# Patient Record
Sex: Male | Born: 1945 | Race: Black or African American | Hispanic: No | Marital: Married | Smoking: Former smoker
Health system: Southern US, Community
[De-identification: ages and names within clinical notes are randomized; demographics above are authoritative.]

## PROBLEM LIST (undated history)

## (undated) DIAGNOSIS — F329 Major depressive disorder, single episode, unspecified: Secondary | ICD-10-CM

## (undated) DIAGNOSIS — I739 Peripheral vascular disease, unspecified: Secondary | ICD-10-CM

## (undated) DIAGNOSIS — M109 Gout, unspecified: Secondary | ICD-10-CM

## (undated) DIAGNOSIS — Z87442 Personal history of urinary calculi: Secondary | ICD-10-CM

## (undated) DIAGNOSIS — N400 Enlarged prostate without lower urinary tract symptoms: Secondary | ICD-10-CM

## (undated) DIAGNOSIS — F32A Depression, unspecified: Secondary | ICD-10-CM

## (undated) DIAGNOSIS — I4891 Unspecified atrial fibrillation: Secondary | ICD-10-CM

## (undated) DIAGNOSIS — E1129 Type 2 diabetes mellitus with other diabetic kidney complication: Secondary | ICD-10-CM

## (undated) DIAGNOSIS — E7800 Pure hypercholesterolemia, unspecified: Secondary | ICD-10-CM

## (undated) DIAGNOSIS — E669 Obesity, unspecified: Secondary | ICD-10-CM

## (undated) DIAGNOSIS — Z9989 Dependence on other enabling machines and devices: Secondary | ICD-10-CM

## (undated) DIAGNOSIS — J449 Chronic obstructive pulmonary disease, unspecified: Secondary | ICD-10-CM

## (undated) DIAGNOSIS — G4733 Obstructive sleep apnea (adult) (pediatric): Secondary | ICD-10-CM

## (undated) DIAGNOSIS — E119 Type 2 diabetes mellitus without complications: Secondary | ICD-10-CM

## (undated) DIAGNOSIS — N184 Chronic kidney disease, stage 4 (severe): Secondary | ICD-10-CM

## (undated) DIAGNOSIS — M1990 Unspecified osteoarthritis, unspecified site: Secondary | ICD-10-CM

## (undated) DIAGNOSIS — I10 Essential (primary) hypertension: Secondary | ICD-10-CM

## (undated) DIAGNOSIS — I509 Heart failure, unspecified: Secondary | ICD-10-CM

## (undated) HISTORY — PX: HERNIA REPAIR: SHX51

## (undated) HISTORY — PX: TEE WITH CARDIOVERSION: SHX5442

## (undated) HISTORY — DX: Obesity, unspecified: E66.9

## (undated) HISTORY — DX: Peripheral vascular disease, unspecified: I73.9

## (undated) HISTORY — DX: Type 2 diabetes mellitus with other diabetic kidney complication: E11.29

## (undated) HISTORY — DX: Chronic obstructive pulmonary disease, unspecified: J44.9

## (undated) HISTORY — DX: Benign prostatic hyperplasia without lower urinary tract symptoms: N40.0

## (undated) HISTORY — DX: Type 2 diabetes mellitus without complications: E11.9

## (undated) HISTORY — DX: Heart failure, unspecified: I50.9

## (undated) HISTORY — PX: INGUINAL HERNIA REPAIR: SUR1180

## (undated) HISTORY — PX: UMBILICAL HERNIA REPAIR: SHX196

---

## 2016-12-30 ENCOUNTER — Inpatient Hospital Stay (HOSPITAL_COMMUNITY)
Admission: EM | Admit: 2016-12-30 | Discharge: 2017-01-04 | DRG: 291 | Disposition: A | Discharge status: Skilled Nursing Facility | Payer: Medicare Other | Attending: Internal Medicine | Admitting: Internal Medicine

## 2016-12-30 ENCOUNTER — Other Ambulatory Visit: Payer: Self-pay

## 2016-12-30 ENCOUNTER — Emergency Department (HOSPITAL_COMMUNITY): Payer: Medicare Other

## 2016-12-30 ENCOUNTER — Encounter (HOSPITAL_COMMUNITY): Payer: Self-pay | Admitting: *Deleted

## 2016-12-30 DIAGNOSIS — Z87891 Personal history of nicotine dependence: Secondary | ICD-10-CM

## 2016-12-30 DIAGNOSIS — I509 Heart failure, unspecified: Secondary | ICD-10-CM

## 2016-12-30 DIAGNOSIS — J9859 Other diseases of mediastinum, not elsewhere classified: Secondary | ICD-10-CM | POA: Diagnosis present

## 2016-12-30 DIAGNOSIS — R599 Enlarged lymph nodes, unspecified: Secondary | ICD-10-CM | POA: Diagnosis present

## 2016-12-30 DIAGNOSIS — G4730 Sleep apnea, unspecified: Secondary | ICD-10-CM | POA: Diagnosis present

## 2016-12-30 DIAGNOSIS — Z79899 Other long term (current) drug therapy: Secondary | ICD-10-CM

## 2016-12-30 DIAGNOSIS — N184 Chronic kidney disease, stage 4 (severe): Secondary | ICD-10-CM | POA: Diagnosis present

## 2016-12-30 DIAGNOSIS — R0902 Hypoxemia: Secondary | ICD-10-CM

## 2016-12-30 DIAGNOSIS — I10 Essential (primary) hypertension: Secondary | ICD-10-CM | POA: Diagnosis present

## 2016-12-30 DIAGNOSIS — I4892 Unspecified atrial flutter: Secondary | ICD-10-CM | POA: Diagnosis present

## 2016-12-30 DIAGNOSIS — Z7901 Long term (current) use of anticoagulants: Secondary | ICD-10-CM

## 2016-12-30 DIAGNOSIS — N289 Disorder of kidney and ureter, unspecified: Secondary | ICD-10-CM

## 2016-12-30 DIAGNOSIS — E7800 Pure hypercholesterolemia, unspecified: Secondary | ICD-10-CM | POA: Diagnosis present

## 2016-12-30 DIAGNOSIS — R911 Solitary pulmonary nodule: Secondary | ICD-10-CM | POA: Diagnosis present

## 2016-12-30 DIAGNOSIS — I130 Hypertensive heart and chronic kidney disease with heart failure and stage 1 through stage 4 chronic kidney disease, or unspecified chronic kidney disease: Principal | ICD-10-CM | POA: Diagnosis present

## 2016-12-30 DIAGNOSIS — I2721 Secondary pulmonary arterial hypertension: Secondary | ICD-10-CM | POA: Diagnosis present

## 2016-12-30 DIAGNOSIS — I5033 Acute on chronic diastolic (congestive) heart failure: Secondary | ICD-10-CM | POA: Diagnosis present

## 2016-12-30 DIAGNOSIS — R0602 Shortness of breath: Secondary | ICD-10-CM

## 2016-12-30 DIAGNOSIS — I472 Ventricular tachycardia: Secondary | ICD-10-CM | POA: Diagnosis not present

## 2016-12-30 DIAGNOSIS — D649 Anemia, unspecified: Secondary | ICD-10-CM | POA: Diagnosis present

## 2016-12-30 DIAGNOSIS — G4733 Obstructive sleep apnea (adult) (pediatric): Secondary | ICD-10-CM | POA: Diagnosis present

## 2016-12-30 DIAGNOSIS — Z6841 Body Mass Index (BMI) 40.0 and over, adult: Secondary | ICD-10-CM

## 2016-12-30 DIAGNOSIS — I4891 Unspecified atrial fibrillation: Secondary | ICD-10-CM | POA: Diagnosis present

## 2016-12-30 DIAGNOSIS — R600 Localized edema: Secondary | ICD-10-CM

## 2016-12-30 HISTORY — DX: Essential (primary) hypertension: I10

## 2016-12-30 HISTORY — DX: Personal history of urinary calculi: Z87.442

## 2016-12-30 HISTORY — DX: Major depressive disorder, single episode, unspecified: F32.9

## 2016-12-30 HISTORY — DX: Pure hypercholesterolemia, unspecified: E78.00

## 2016-12-30 HISTORY — DX: Depression, unspecified: F32.A

## 2016-12-30 HISTORY — DX: Unspecified atrial fibrillation: I48.91

## 2016-12-30 HISTORY — DX: Obstructive sleep apnea (adult) (pediatric): G47.33

## 2016-12-30 HISTORY — DX: Dependence on other enabling machines and devices: Z99.89

## 2016-12-30 HISTORY — DX: Type 2 diabetes mellitus without complications: E11.9

## 2016-12-30 HISTORY — DX: Gout, unspecified: M10.9

## 2016-12-30 HISTORY — DX: Unspecified osteoarthritis, unspecified site: M19.90

## 2016-12-30 HISTORY — DX: Heart failure, unspecified: I50.9

## 2016-12-30 HISTORY — DX: Chronic kidney disease, stage 4 (severe): N18.4

## 2016-12-30 LAB — CBC
HEMATOCRIT: 35.2 % — AB (ref 39.0–52.0)
HEMOGLOBIN: 10.7 g/dL — AB (ref 13.0–17.0)
MCH: 30.3 pg (ref 26.0–34.0)
MCHC: 30.4 g/dL (ref 30.0–36.0)
MCV: 99.7 fL (ref 78.0–100.0)
Platelets: 218 10*3/uL (ref 150–400)
RBC: 3.53 MIL/uL — ABNORMAL LOW (ref 4.22–5.81)
RDW: 15.1 % (ref 11.5–15.5)
WBC: 4 10*3/uL (ref 4.0–10.5)

## 2016-12-30 LAB — BASIC METABOLIC PANEL
ANION GAP: 8 (ref 5–15)
BUN: 35 mg/dL — AB (ref 6–20)
CO2: 27 mmol/L (ref 22–32)
Calcium: 8.5 mg/dL — ABNORMAL LOW (ref 8.9–10.3)
Chloride: 104 mmol/L (ref 101–111)
Creatinine, Ser: 3.28 mg/dL — ABNORMAL HIGH (ref 0.61–1.24)
GFR calc non Af Amer: 18 mL/min — ABNORMAL LOW (ref >60)
GFR, EST AFRICAN AMERICAN: 21 mL/min — AB (ref >60)
GLUCOSE: 147 mg/dL — AB (ref 65–99)
Potassium: 4.2 mmol/L (ref 3.5–5.1)
SODIUM: 139 mmol/L (ref 135–145)

## 2016-12-30 LAB — I-STAT TROPONIN, ED: Troponin i, poc: 0.01 ng/mL (ref 0.00–0.08)

## 2016-12-30 MED ORDER — ALBUTEROL SULFATE (2.5 MG/3ML) 0.083% IN NEBU
5.0000 mg | INHALATION_SOLUTION | Freq: Once | RESPIRATORY_TRACT | Status: DC
  Filled 2016-12-30: qty 6

## 2016-12-30 NOTE — ED Triage Notes (Signed)
Pt reports SOB and "falling asleep", states "I could be standing at home, next thing I know I am picking myself up off the floor."  Pt and his wife reports that this has been going on for months.  She states she has been trying to convince him to come to the ED to get checked out but he kept saying that he is okay.  She also reports that he coughs up "frothy" sputum.  Pt is A&O  X 4.  He endorses cp when coughing.  He wears c-pap at night.  No home O2.  Pt reports swelling in his bila LE and he gets SOB on exertion.

## 2016-12-31 ENCOUNTER — Encounter (HOSPITAL_COMMUNITY): Payer: Self-pay | Admitting: Family Medicine

## 2016-12-31 ENCOUNTER — Other Ambulatory Visit: Payer: Self-pay

## 2016-12-31 ENCOUNTER — Emergency Department (HOSPITAL_COMMUNITY): Payer: Medicare Other

## 2016-12-31 ENCOUNTER — Other Ambulatory Visit (HOSPITAL_COMMUNITY): Payer: Medicare Other

## 2016-12-31 DIAGNOSIS — Z79899 Other long term (current) drug therapy: Secondary | ICD-10-CM | POA: Diagnosis not present

## 2016-12-31 DIAGNOSIS — J9859 Other diseases of mediastinum, not elsewhere classified: Secondary | ICD-10-CM | POA: Diagnosis present

## 2016-12-31 DIAGNOSIS — R0902 Hypoxemia: Secondary | ICD-10-CM

## 2016-12-31 DIAGNOSIS — I2721 Secondary pulmonary arterial hypertension: Secondary | ICD-10-CM | POA: Diagnosis present

## 2016-12-31 DIAGNOSIS — I5033 Acute on chronic diastolic (congestive) heart failure: Secondary | ICD-10-CM | POA: Diagnosis present

## 2016-12-31 DIAGNOSIS — I130 Hypertensive heart and chronic kidney disease with heart failure and stage 1 through stage 4 chronic kidney disease, or unspecified chronic kidney disease: Secondary | ICD-10-CM | POA: Diagnosis present

## 2016-12-31 DIAGNOSIS — I509 Heart failure, unspecified: Secondary | ICD-10-CM | POA: Diagnosis not present

## 2016-12-31 DIAGNOSIS — D649 Anemia, unspecified: Secondary | ICD-10-CM | POA: Diagnosis present

## 2016-12-31 DIAGNOSIS — N184 Chronic kidney disease, stage 4 (severe): Secondary | ICD-10-CM | POA: Diagnosis present

## 2016-12-31 DIAGNOSIS — R599 Enlarged lymph nodes, unspecified: Secondary | ICD-10-CM | POA: Diagnosis present

## 2016-12-31 DIAGNOSIS — I10 Essential (primary) hypertension: Secondary | ICD-10-CM | POA: Diagnosis present

## 2016-12-31 DIAGNOSIS — Z87891 Personal history of nicotine dependence: Secondary | ICD-10-CM | POA: Diagnosis not present

## 2016-12-31 DIAGNOSIS — R911 Solitary pulmonary nodule: Secondary | ICD-10-CM | POA: Diagnosis present

## 2016-12-31 DIAGNOSIS — I472 Ventricular tachycardia: Secondary | ICD-10-CM | POA: Diagnosis not present

## 2016-12-31 DIAGNOSIS — G4730 Sleep apnea, unspecified: Secondary | ICD-10-CM | POA: Diagnosis present

## 2016-12-31 DIAGNOSIS — I4891 Unspecified atrial fibrillation: Secondary | ICD-10-CM | POA: Diagnosis present

## 2016-12-31 DIAGNOSIS — G4733 Obstructive sleep apnea (adult) (pediatric): Secondary | ICD-10-CM | POA: Diagnosis present

## 2016-12-31 DIAGNOSIS — Z7901 Long term (current) use of anticoagulants: Secondary | ICD-10-CM | POA: Diagnosis not present

## 2016-12-31 DIAGNOSIS — Z6841 Body Mass Index (BMI) 40.0 and over, adult: Secondary | ICD-10-CM | POA: Diagnosis not present

## 2016-12-31 DIAGNOSIS — R0602 Shortness of breath: Secondary | ICD-10-CM | POA: Diagnosis present

## 2016-12-31 DIAGNOSIS — I361 Nonrheumatic tricuspid (valve) insufficiency: Secondary | ICD-10-CM | POA: Diagnosis not present

## 2016-12-31 DIAGNOSIS — E7800 Pure hypercholesterolemia, unspecified: Secondary | ICD-10-CM | POA: Diagnosis present

## 2016-12-31 DIAGNOSIS — I4892 Unspecified atrial flutter: Secondary | ICD-10-CM | POA: Diagnosis present

## 2016-12-31 HISTORY — DX: Heart failure, unspecified: I50.9

## 2016-12-31 LAB — I-STAT ARTERIAL BLOOD GAS, ED
Acid-Base Excess: 3 mmol/L — ABNORMAL HIGH (ref 0.0–2.0)
Bicarbonate: 29.2 mmol/L — ABNORMAL HIGH (ref 20.0–28.0)
O2 Saturation: 92 %
PCO2 ART: 52.8 mmHg — AB (ref 32.0–48.0)
PO2 ART: 69 mmHg — AB (ref 83.0–108.0)
Patient temperature: 98.7
TCO2: 31 mmol/L (ref 22–32)
pH, Arterial: 7.351 (ref 7.350–7.450)

## 2016-12-31 LAB — PROTIME-INR
INR: 4.24 — AB
PROTHROMBIN TIME: 40.2 s — AB (ref 11.4–15.2)

## 2016-12-31 LAB — BRAIN NATRIURETIC PEPTIDE: B NATRIURETIC PEPTIDE 5: 259.1 pg/mL — AB (ref 0.0–100.0)

## 2016-12-31 LAB — TROPONIN I

## 2016-12-31 MED ORDER — ONDANSETRON HCL 4 MG/2ML IJ SOLN: 4.0000 mg | Freq: Four times a day (QID) | INTRAMUSCULAR | Status: DC | PRN

## 2016-12-31 MED ORDER — FUROSEMIDE 10 MG/ML IJ SOLN
40.0000 mg | Freq: Two times a day (BID) | INTRAMUSCULAR | Status: DC
  Administered 2016-12-31 – 2017-01-01 (×4): 40 mg via INTRAVENOUS
  Filled 2016-12-31 (×4): qty 4

## 2016-12-31 MED ORDER — SODIUM CHLORIDE 0.9% FLUSH: 3.0000 mL | INTRAVENOUS | Status: DC | PRN

## 2016-12-31 MED ORDER — ZINC SULFATE 220 (50 ZN) MG PO CAPS
220.0000 mg | ORAL_CAPSULE | Freq: Every day | ORAL | Status: DC
  Administered 2016-12-31 – 2017-01-04 (×5): 220 mg via ORAL
  Filled 2016-12-31 (×5): qty 1

## 2016-12-31 MED ORDER — FLUTICASONE PROPIONATE 50 MCG/ACT NA SUSP: 2.0000 | Freq: Every day | NASAL | Status: DC | PRN

## 2016-12-31 MED ORDER — LIP MEDEX EX OINT
1.0000 "application " | TOPICAL_OINTMENT | CUTANEOUS | Status: DC | PRN
  Filled 2016-12-31: qty 7

## 2016-12-31 MED ORDER — SALINE SPRAY 0.65 % NA SOLN
1.0000 | NASAL | Status: DC | PRN
  Filled 2016-12-31: qty 44

## 2016-12-31 MED ORDER — VITAMIN D 1000 UNITS PO TABS
2000.0000 [IU] | ORAL_TABLET | Freq: Every day | ORAL | Status: DC
  Administered 2016-12-31 – 2017-01-04 (×5): 2000 [IU] via ORAL
  Filled 2016-12-31 (×5): qty 2

## 2016-12-31 MED ORDER — SODIUM CHLORIDE 0.9% FLUSH
3.0000 mL | Freq: Two times a day (BID) | INTRAVENOUS | Status: DC
  Administered 2016-12-31 – 2017-01-04 (×7): 3 mL via INTRAVENOUS

## 2016-12-31 MED ORDER — MUSCLE RUB 10-15 % EX CREA
1.0000 "application " | TOPICAL_CREAM | CUTANEOUS | Status: DC | PRN
  Administered 2017-01-02: 1 via TOPICAL
  Filled 2016-12-31: qty 85

## 2016-12-31 MED ORDER — PRAVASTATIN SODIUM 40 MG PO TABS
40.0000 mg | ORAL_TABLET | Freq: Every day | ORAL | Status: DC
  Administered 2016-12-31 – 2017-01-03 (×4): 40 mg via ORAL
  Filled 2016-12-31 (×4): qty 1

## 2016-12-31 MED ORDER — TAMSULOSIN HCL 0.4 MG PO CAPS
0.4000 mg | ORAL_CAPSULE | Freq: Every day | ORAL | Status: DC
  Administered 2016-12-31 – 2017-01-03 (×4): 0.4 mg via ORAL
  Filled 2016-12-31 (×4): qty 1

## 2016-12-31 MED ORDER — PHENOL 1.4 % MT LIQD: 1.0000 | OROMUCOSAL | Status: DC | PRN

## 2016-12-31 MED ORDER — LABETALOL HCL 200 MG PO TABS
300.0000 mg | ORAL_TABLET | Freq: Two times a day (BID) | ORAL | Status: DC
  Administered 2016-12-31 – 2017-01-04 (×9): 300 mg via ORAL
  Filled 2016-12-31 (×8): qty 1

## 2016-12-31 MED ORDER — DOCUSATE SODIUM 100 MG PO CAPS
100.0000 mg | ORAL_CAPSULE | Freq: Two times a day (BID) | ORAL | Status: DC
  Administered 2016-12-31 – 2017-01-04 (×8): 100 mg via ORAL
  Filled 2016-12-31 (×9): qty 1

## 2016-12-31 MED ORDER — HYDROCORTISONE 2.5 % RE CREA: 1.0000 "application " | TOPICAL_CREAM | Freq: Four times a day (QID) | RECTAL | Status: DC | PRN

## 2016-12-31 MED ORDER — HYDRALAZINE HCL 20 MG/ML IJ SOLN
10.0000 mg | INTRAMUSCULAR | Status: DC | PRN
  Administered 2016-12-31: 10 mg via INTRAVENOUS
  Filled 2016-12-31: qty 1

## 2016-12-31 MED ORDER — ALUM & MAG HYDROXIDE-SIMETH 200-200-20 MG/5ML PO SUSP: 30.0000 mL | ORAL | Status: DC | PRN

## 2016-12-31 MED ORDER — B COMPLEX-C PO TABS
1.0000 | ORAL_TABLET | Freq: Every day | ORAL | Status: DC
  Administered 2016-12-31 – 2017-01-04 (×5): 1 via ORAL
  Filled 2016-12-31 (×5): qty 1

## 2016-12-31 MED ORDER — FUROSEMIDE 10 MG/ML IJ SOLN
80.0000 mg | Freq: Once | INTRAMUSCULAR | Status: AC
  Administered 2016-12-31: 80 mg via INTRAVENOUS
  Filled 2016-12-31: qty 8

## 2016-12-31 MED ORDER — ACETAMINOPHEN 325 MG PO TABS
650.0000 mg | ORAL_TABLET | ORAL | Status: DC | PRN
  Administered 2017-01-02 (×2): 650 mg via ORAL
  Filled 2016-12-31 (×2): qty 2

## 2016-12-31 MED ORDER — BLISTEX MEDICATED EX OINT
TOPICAL_OINTMENT | CUTANEOUS | Status: DC | PRN
  Filled 2016-12-31: qty 6.3

## 2016-12-31 MED ORDER — AMLODIPINE BESYLATE 10 MG PO TABS
10.0000 mg | ORAL_TABLET | Freq: Every day | ORAL | Status: DC
  Administered 2016-12-31 – 2017-01-04 (×5): 10 mg via ORAL
  Filled 2016-12-31 (×2): qty 1
  Filled 2016-12-31: qty 2
  Filled 2016-12-31 (×2): qty 1

## 2016-12-31 MED ORDER — B COMPLEX VITAMINS PO CAPS: 1.0000 | ORAL_CAPSULE | Freq: Every day | ORAL | Status: DC

## 2016-12-31 MED ORDER — GUAIFENESIN-DM 100-10 MG/5ML PO SYRP
5.0000 mL | ORAL_SOLUTION | ORAL | Status: DC | PRN
  Administered 2017-01-02 – 2017-01-04 (×2): 5 mL via ORAL
  Filled 2016-12-31 (×2): qty 5

## 2016-12-31 MED ORDER — HEPARIN SODIUM (PORCINE) 5000 UNIT/ML IJ SOLN: 5000.0000 [IU] | Freq: Three times a day (TID) | INTRAMUSCULAR | Status: DC

## 2016-12-31 MED ORDER — POLYVINYL ALCOHOL 1.4 % OP SOLN
2.0000 [drp] | OPHTHALMIC | Status: DC | PRN
  Filled 2016-12-31: qty 15

## 2016-12-31 MED ORDER — SODIUM CHLORIDE 0.9 % IV SOLN: 250.0000 mL | INTRAVENOUS | Status: DC | PRN

## 2016-12-31 NOTE — Progress Notes (Signed)
Patient seen and evaluated earlier today by my associate. Plan will be to continue workup described in H&P.  Patient will be continued on Lasix 40 mg IV every 12 hours. Will assess results of echocardiogram. Continue beta blocker given history of atrial flutter.  Gen.: Patient in no acute distress Cardiovascular: S1 and S2 within normal limits Pulmonary: No increased work of breathing, no wheezes  [name], [employer]

## 2016-12-31 NOTE — ED Notes (Signed)
Ambulated pt w/ pulse ox. Pt managed to ambulate a little around NS and started feeling SHOB & desating. Deasated down to 92% & HR went up to 112. EDP made aware.

## 2016-12-31 NOTE — ED Notes (Signed)
Heart healthy lunch tray ordered 

## 2016-12-31 NOTE — H&P (Signed)
History and Physical    Mark Adkins OIN:[id number] DOB: 10-01-1945 DOA: 12/30/2016  PCP: Sinclair Ship, MD   Patient coming from: Home  Chief Complaint: SOB, swelling, orthopnea   HPI: Mark Adkins is a 71 y.o. male with medical history significant for atrial fibrillation/flutter on warfarin, chronic CHF, chronic kidney disease stage IV, hypertension, and OSA on CPAP, now presenting to the emergency department for evaluation of progressive shortness of breath, orthopnea, and swelling.  Patient also reports lethargy and increased somnolence for the past couple months.  Wife notes that she has been encouraging him to seek medical evaluation for this for multiple months without success until tonight.  Denies chest pain.  Denies fevers or chills.  Reports occasionally coughing up frothy sputum.  Has been using increasing number of pillows in order to sleep at night.  Reports some chronic edema, but notes that it has progressively worsened over the past couple months and he has gained weight.  ED Course: Upon arrival to the ED, patient is found to be afebrile, saturating low 90s on room air, hypertensive, and with vitals otherwise stable.  EKG features atrial flutter with nonspecific IVCD and inferolateral ST elevations.  Chest x-ray is notable for increased pulmonary vascular congestion.  Chemistry panel features of a BUN of 35 and creatinine of 3.28, up from 2.3 two years ago.  CBC is notable for a normocytic anemia with hemoglobin of 10.7.  INR is elevated to a value of 4.24, BNP is elevated to 259, and troponin is negative x2.  There was a subtle abnormality on the chest x-ray that was followed with CT chest, revealing a tiny right lower lobe subpleural nodule and prominent paratracheal lymph node versus fluid in the superior pericardial recess.  Patient was treated with 80 mg IV Lasix in the ED.  He remained a little hypertensive, but otherwise stable, and will be admitted to the telemetry unit for  ongoing evaluation and shortness of breath, swelling, and orthopnea, suspected secondary to acute on chronic CHF.  Review of Systems:  All other systems reviewed and apart from HPI, are negative.  Past Medical History:  Diagnosis Date  . Atrial fibrillation ([place])   . Hypercholesterolemia   . Hypertension   . Sleep apnea     Past Surgical History:  Procedure Laterality Date  . ABDOMINAL SURGERY     hernia repair     reports that  has never smoked. he has never used smokeless tobacco. He reports that he does not drink alcohol or use drugs.  No Known Allergies  Family History  Problem Relation Age of Onset  . Sudden Cardiac Death Neg Hx      Prior to Admission medications   Medication Sig Start Date End Date Taking? Authorizing Provider  acetaminophen (TYLENOL) 500 MG tablet Take 1,000 mg by mouth 2 (two) times daily.   Yes [provider]  amLODipine (NORVASC) 10 MG tablet Take 10 mg by mouth daily.   Yes [provider]  Ascorbic Acid (VITAMIN C WITH ROSE HIPS) 1000 MG tablet Take 1,000 mg by mouth daily.   Yes [provider]  b complex vitamins capsule Take 1 capsule by mouth daily.   Yes [provider]  Cholecalciferol (VITAMIN D3) 2000 units capsule Take 2,000 Units by mouth daily.   Yes [provider]  docusate sodium (COLACE) 100 MG capsule Take 100 mg by mouth 2 (two) times daily.   Yes [provider]  fluticasone (FLONASE) 50 MCG/ACT nasal spray  Place 2 sprays into both nostrils daily as needed for allergies or rhinitis.   Yes [provider]  furosemide (LASIX) 40 MG tablet Take 40 mg by mouth 2 (two) times daily.   Yes [provider]  labetalol (NORMODYNE) 300 MG tablet Take 300 mg by mouth 2 (two) times daily.   Yes [provider]  nitroGLYCERIN (NITROSTAT) 0.4 MG SL tablet Place 0.4 mg under the tongue every 5 (five) minutes as needed for chest pain.   Yes [provider]    pravastatin (PRAVACHOL) 40 MG tablet Take 40 mg by mouth at bedtime. 02/09/14  Yes [provider]  tamsulosin (FLOMAX) 0.4 MG CAPS capsule Take 0.4 mg by mouth daily after supper.   Yes [provider]  vitamin E 1000 UNIT capsule Take 1,000 Units by mouth daily.   Yes [provider]  warfarin (COUMADIN) 5 MG tablet Take 5 mg by mouth See admin instructions. Take 7.5 mg  On Sun. Mon, Wed and Friday Take 10 mg tues, thurs, and saturday   Yes [provider]  zinc sulfate 220 (50 Zn) MG capsule Take 220 mg by mouth daily.   Yes [provider]    Physical Exam: Vitals:   12/31/16 0000 12/31/16 0130 12/31/16 0200 12/31/16 0230  BP: (!) 142/97 (!) 154/96 (!) 161/107 (!) 169/94  Pulse: 74 93 86 (!) 53  Resp: 19 20 16 17   Temp:      TempSrc:      SpO2: 94% 95% 91% 90%  Weight:      Height:          Constitutional: NAD, calm, obese Eyes: PERTLA, lids and conjunctivae normal ENMT: Mucous membranes are moist. Posterior pharynx clear of any exudate or lesions.   Neck: normal, supple, no masses, no thyromegaly Respiratory: clear to auscultation bilaterally, no wheezing, no crackles. Dyspnea with speech. No accessory muscle recruitment. Cardiovascular: S1 & S2 heard, regular rate and rhythm. Bilateral leg edema.  Abdomen: No distension, no tenderness, no masses palpated. Bowel sounds normal.  Musculoskeletal: no clubbing / cyanosis. No joint deformity upper and lower extremities.   Skin: no significant rashes, lesions, ulcers. Warm, dry, well-perfused. Neurologic: CN 2-12 grossly intact. Sensation intact. Strength 5/5 in all 4 limbs.  Psychiatric: Alert and oriented x 3. Calm, cooperative.     Labs on Admission: I have personally reviewed following labs and imaging studies  CBC: Recent Labs  Lab 12/30/16 1629  WBC 4.0  HGB 10.7*  HCT 35.2*  MCV 99.7  PLT 301   Basic Metabolic Panel: Recent Labs  Lab 12/30/16 1629  NA 139  K 4.2   CL 104  CO2 27  GLUCOSE 147*  BUN 35*  CREATININE 3.28*  CALCIUM 8.5*   GFR: Estimated Creatinine Clearance: 32.1 mL/min (A) (by C-G formula based on SCr of 3.28 mg/dL (H)). Liver Function Tests: No results for input(s): AST, ALT, ALKPHOS, BILITOT, PROT, ALBUMIN in the last 168 hours. No results for input(s): LIPASE, AMYLASE in the last 168 hours. No results for input(s): AMMONIA in the last 168 hours. Coagulation Profile: Recent Labs  Lab 12/31/16 0052  INR 4.24*   Cardiac Enzymes: Recent Labs  Lab 12/31/16 0052  TROPONINI <0.03   BNP (last 3 results) No results for input(s): PROBNP in the last 8760 hours. HbA1C: No results for input(s): HGBA1C in the last 72 hours. CBG: No results for input(s): GLUCAP in the last 168 hours. Lipid Profile: No results for input(s): CHOL,  HDL, LDLCALC, TRIG, CHOLHDL, LDLDIRECT in the last 72 hours. Thyroid Function Tests: No results for input(s): TSH, T4TOTAL, FREET4, T3FREE, THYROIDAB in the last 72 hours. Anemia Panel: No results for input(s): VITAMINB12, FOLATE, FERRITIN, TIBC, IRON, RETICCTPCT in the last 72 hours. Urine analysis: No results found for: COLORURINE, APPEARANCEUR, LABSPEC, PHURINE, GLUCOSEU, HGBUR, BILIRUBINUR, KETONESUR, PROTEINUR, UROBILINOGEN, NITRITE, LEUKOCYTESUR Sepsis Labs: @LABRCNTIP (procalcitonin:4,lacticidven:4) )No results found for this or any previous visit (from the past 240 hour(s)).   Radiological Exams on Admission: Dg Chest 2 View  Result Date: 12/30/2016 CLINICAL DATA:  71 year old male with increasing shortness of breath with exertion. Chronic cough. EXAM: CHEST  2 VIEW COMPARISON:  None. FINDINGS: Cardiac size is at the upper limits of normal to mildly enlarged. There is mild tortuosity of the thoracic aorta. There is prominent central pulmonary vasculature, and an asymmetrically rounded appearance of the right hilum on the frontal view. Questionable hilar enlargement on the lateral view (3-3.5 cm  diameter). Randolm Idol* ED Delice Lesch a mother Visualized tracheal air column is within normal limits. No pneumothorax, pleural effusion or confluent pulmonary opacity. Increased pulmonary vascularity, but no minimal if any pleural fluid in the fissures. Negative visible bowel gas pattern. No acute osseous abnormality identified. IMPRESSION: 1. No prior study for comparison. 2. Questionable abnormal right hilar enlargement. CT Chest with IV contrast would evaluate further. 3. Increased pulmonary vascularity, such that mild or developing interstitial edema is difficult to exclude. No pleural effusion identified. 4. Mild cardiomegaly. Electronically Signed   By: Genevie Ann M.D.   On: 12/30/2016 17:11   Ct Chest Wo Contrast  Result Date: 12/31/2016 CLINICAL DATA:  Shortness of breath. EXAM: CT CHEST WITHOUT CONTRAST TECHNIQUE: Multidetector CT imaging of the chest was performed following the standard protocol without IV contrast. COMPARISON:  Chest radiographs yesterday. FINDINGS: Cardiovascular: Mild multi chamber cardiomegaly. There are coronary artery calcifications. Enlargement of the main pulmonary artery measuring 4.6 cm. Mild atherosclerosis of the thoracic aorta. Mediastinum/Nodes: Probable upper paratracheal node measures 16 mm. This is contiguous with lobular low-density that may reflect a low-density lymph node versus fluid in the superior pericardial recess. Additional smaller mediastinal nodes are not enlarged by size criteria. There is no bulky hilar adenopathy, lack of IV contrast limits assessment of the hila. The esophagus is nondistended. Visualized thyroid gland is normal. No axillary adenopathy. Lungs/Pleura: Small Bochdalek hernia on the left containing fat. Tiny subpleural 4 mm nodule in the right lower lobe image 125 series 4. Minimal scattered subpleural and perifissural atelectasis in both lungs. No consolidation. No evidence pulmonary edema. No pleural effusion. Trachea and mainstem bronchi are  patent. Upper Abdomen: Incidental colonic diverticulosis. No acute abnormality. Musculoskeletal: Bulky anterior osteophytes in the midthoracic spine. There are no acute or suspicious osseous abnormalities. IMPRESSION: 1. Multi chamber cardiomegaly. There are coronary artery calcifications. Mild aortic atherosclerosis. 2. Enlargement of the main pulmonary artery (4.6 cm) consistent with pulmonary arterial hypertension. Right hilar prominence on prior radiograph is likely secondary to enlarged pulmonary artery. No bulky adenopathy, however or hilar evaluation is limited in the absence of IV contrast. 3. Prominent paratracheal lymph nodes versus fluid in the superior pericardial recess. Recommend clinical consultation and 3-6 month follow-up CT. 4. Tiny subpleural 4 mm right lower lobe nodule. This is likely an intrapulmonary lymph node, however no prior exams available for comparison. No follow-up needed if patient is low-risk. Non-contrast chest CT can be considered in 12 months if patient is high-risk. This recommendation follows the consensus statement: Guidelines for Management of  Incidental Pulmonary Nodules Detected on CT Images: From the Fleischner Society 2017; Radiology 2017; 284:228-243. Aortic Atherosclerosis (ICD10-I70.0). Electronically Signed   By: Jeb Levering M.D.   On: 12/31/2016 00:58    EKG: Independently reviewed. Atrial flutter, non-specific IVCD,   Assessment/Plan  1. Acute on chronic CHF  - Pt presents with progressive BLE edema, orthopnea, and SOB  - Found to have elevated BNP and marked peripheral edema  - Treated with Lasix 80 mg IV in ED  - Plan to continue cardiac monitoring, continue diuresis with Lasix 40 mg IV q12h, SLIV, follow daily wt and strict I/O's, update echocardiogram, continue beta-blocker    2. CKD stage IV  - SCr is 3.28 on admission, up from most recent prior of 2.3 from 2 years ago  - Possibly a component of acute insufficiency secondary to volume o/l  -  Follow daily chem panel during diuresis, renally-dose medications, avoid nephrotoxins   3. Atrial flutter  - Pt is atrial flutter on presentation  - CHADS-VASc is at least 3 (age, CHF, HTN)  - INR is 4.24 on admission  - Continue cardiac monitoring during diuresis, hold warfarin, repeat INR tomorrow, continue beta-blocker   4. Hypertension  - BP elevated in ED - Continue diuresis, continue beta-blocker, start prn hydralazine    5. Anemia  - Hgb is 10.7 on admission; most recent prior was 13.1 two years ago  - No bleeding evident  - Likely a component of hemodilution in setting of acute on chronic CHF; worsening CKD may also be contributing  - Continue B-vitamin supplements    6. Incidental CT findings - Prominent paratracheal LN's vs fluid in superior pericardial recess noted on CT with follow-up recommended  - Tiny RLL subpleural nodule noted on CT with f/u based on risk     DVT prophylaxis: warfarin Code Status: Full  Family Communication: Wife updated at bedside Disposition Plan: Admit to telemetry Consults called: None Admission status: Inpatient    Vianne Bulls, MD [facility] Pager [phone]  If 7PM-7AM, please contact night-coverage [email or web address] Password [facility]  12/31/2016, 5:38 AM

## 2016-12-31 NOTE — ED Notes (Signed)
Attempted report 

## 2016-12-31 NOTE — Progress Notes (Signed)
Notified Dr. Berneice Gandy pt's elevated BP.

## 2016-12-31 NOTE — ED Provider Notes (Signed)
[place] [facility] EMERGENCY DEPARTMENT Provider Note   CSN: [id number] Arrival date & time: 12/30/16  1608     History   Chief Complaint Chief Complaint  Patient presents with  . Shortness of Breath  . Fatigue    HPI Mark Adkins is a 71 y.o. male.  HPI  This is a 71 year old male with a history of atrial fibrillation, hypertension, hypercholesterolemia, sleep apnea who presents with worsening shortness of breath, orthopnea, weight gain, lower extremity edema.  Symptoms have worsened over the last 3 weeks; however, patient reports increasing system over the last several months.  He reports several pillow orthopnea.  He reports a nonproductive cough.  No fevers.  Denies chest pain.  Has noted increasing lower extremity edema.  Reports that he was diagnosed with lymphedema and is now wearing compression dressings.  He feels that this is getting worse.  Wife is noted over the last several days that he is falling asleep in the middle of conversations.  Remote history of smoking.  No known history of heart failure.  Reports echocardiogram was normal approximately 10 years ago.  Denies history of MI.  Past Medical History:  Diagnosis Date  . Atrial fibrillation ([place])   . Hypercholesterolemia   . Hypertension   . Sleep apnea     There are no active problems to display for this patient.   Past Surgical History:  Procedure Laterality Date  . ABDOMINAL SURGERY     hernia repair       Home Medications    Prior to Admission medications   Medication Sig Start Date End Date Taking? Authorizing Provider  acetaminophen (TYLENOL) 500 MG tablet Take 1,000 mg by mouth 2 (two) times daily.   Yes [provider]  amLODipine (NORVASC) 10 MG tablet Take 10 mg by mouth daily.   Yes [provider]  Ascorbic Acid (VITAMIN C WITH ROSE HIPS) 1000 MG tablet Take 1,000 mg by mouth daily.   Yes [provider]  b complex vitamins capsule Take 1 capsule by  mouth daily.   Yes [provider]  Cholecalciferol (VITAMIN D3) 2000 units capsule Take 2,000 Units by mouth daily.   Yes [provider]  docusate sodium (COLACE) 100 MG capsule Take 100 mg by mouth 2 (two) times daily.   Yes [provider]  fluticasone (FLONASE) 50 MCG/ACT nasal spray Place 2 sprays into both nostrils daily as needed for allergies or rhinitis.   Yes [provider]  furosemide (LASIX) 40 MG tablet Take 40 mg by mouth 2 (two) times daily.   Yes [provider]  labetalol (NORMODYNE) 300 MG tablet Take 300 mg by mouth 2 (two) times daily.   Yes [provider]  nitroGLYCERIN (NITROSTAT) 0.4 MG SL tablet Place 0.4 mg under the tongue every 5 (five) minutes as needed for chest pain.   Yes [provider]  pravastatin (PRAVACHOL) 40 MG tablet Take 40 mg by mouth at bedtime. 02/09/14  Yes [provider]  tamsulosin (FLOMAX) 0.4 MG CAPS capsule Take 0.4 mg by mouth daily after supper.   Yes [provider]  vitamin E 1000 UNIT capsule Take 1,000 Units by mouth daily.   Yes [provider]  warfarin (COUMADIN) 5 MG tablet Take 5 mg by mouth See admin instructions. Take 7.5 mg  On Sun. Mon, Wed and Friday Take 10 mg tues, thurs, and saturday   Yes [provider]  zinc sulfate 220 (50 Zn) MG capsule  Take 220 mg by mouth daily.   Yes [provider]    Family History No family history on file.  Social History Social History   Tobacco Use  . Smoking status: Never Smoker  . Smokeless tobacco: Never Used  Substance Use Topics  . Alcohol use: No    Frequency: Never  . Drug use: No     Allergies   Patient has no known allergies.   Review of Systems Review of Systems  Constitutional: Negative for fever.  Respiratory: Positive for cough, shortness of breath and wheezing.   Cardiovascular: Positive for leg swelling. Negative for chest pain.  Gastrointestinal: Negative  for abdominal pain, nausea and vomiting.  Genitourinary: Negative for dysuria.  All other systems reviewed and are negative.    Physical Exam Updated Vital Signs BP (!) 169/94   Pulse (!) 53   Temp 98 F (36.7 C) (Oral)   Resp 17   Ht 6' (1.829 m)   Wt (!) 150.6 kg (332 lb)   SpO2 90%   BMI 45.03 kg/m   Physical Exam  Constitutional: He is oriented to person, place, and time. He appears well-developed and well-nourished.  Morbidly obese, no acute distress on room air  HENT:  Head: Normocephalic and atraumatic.  Cardiovascular: Normal rate, regular rhythm and normal heart sounds.  No murmur heard. Distant heart sounds  Pulmonary/Chest: Effort normal. No respiratory distress. He has wheezes.  Wheezing most prominent in the bilateral upper lobes  Abdominal: Soft. Bowel sounds are normal. He exhibits no ascites. There is no tenderness. There is no rebound.  Musculoskeletal:       Right lower leg: He exhibits edema.       Left lower leg: He exhibits edema.  Chronic venous stasis changes of the bilateral lower extremities with 3+ pitting edema  Neurological: He is alert and oriented to person, place, and time.  Skin: Skin is warm and dry.  Psychiatric: He has a normal mood and affect.  Nursing note and vitals reviewed.    ED Treatments / Results  Labs (all labs ordered are listed, but only abnormal results are displayed) Labs Reviewed  BASIC METABOLIC PANEL - Abnormal; Notable for the following components:      Result Value   Glucose, Bld 147 (*)    BUN 35 (*)    Creatinine, Ser 3.28 (*)    Calcium 8.5 (*)    GFR calc non Af Amer 18 (*)    GFR calc Af Amer 21 (*)    All other components within normal limits  CBC - Abnormal; Notable for the following components:   RBC 3.53 (*)    Hemoglobin 10.7 (*)    HCT 35.2 (*)    All other components within normal limits  BRAIN NATRIURETIC PEPTIDE - Abnormal; Notable for the following components:   B Natriuretic Peptide 259.1  (*)    All other components within normal limits  PROTIME-INR - Abnormal; Notable for the following components:   Prothrombin Time 40.2 (*)    INR 4.24 (*)    All other components within normal limits  I-STAT ARTERIAL BLOOD GAS, ED - Abnormal; Notable for the following components:   pCO2 arterial 52.8 (*)    pO2, Arterial 69.0 (*)    Bicarbonate 29.2 (*)    Acid-Base Excess 3.0 (*)    All other components within normal limits  TROPONIN I  I-STAT TROPONIN, ED    EKG  EKG Interpretation  Date/Time:  Wednesday December 31 2016 00:39:25 EST Ventricular Rate:  56 PR Interval:    QRS Duration: 115 QT Interval:  490 QTC Calculation: 473 R Axis:   88 Text Interpretation:  Atrial flutter with predominant 4:1 AV block Nonspecific intraventricular conduction delay Anteroseptal infarct, age indeterminate ST elevation, consider inferior injury Lateral leads are also involved Confirmed by Thayer Jew [id number]) on 12/31/2016 12:43:10 AM       Radiology Dg Chest 2 View  Result Date: 12/30/2016 CLINICAL DATA:  71 year old male with increasing shortness of breath with exertion. Chronic cough. EXAM: CHEST  2 VIEW COMPARISON:  None. FINDINGS: Cardiac size is at the upper limits of normal to mildly enlarged. There is mild tortuosity of the thoracic aorta. There is prominent central pulmonary vasculature, and an asymmetrically rounded appearance of the right hilum on the frontal view. Questionable hilar enlargement on the lateral view (3-3.5 cm diameter). Randolm Idol* ED Delice Lesch a mother Visualized tracheal air column is within normal limits. No pneumothorax, pleural effusion or confluent pulmonary opacity. Increased pulmonary vascularity, but no minimal if any pleural fluid in the fissures. Negative visible bowel gas pattern. No acute osseous abnormality identified. IMPRESSION: 1. No prior study for comparison. 2. Questionable abnormal right hilar enlargement. CT Chest with IV contrast would evaluate  further. 3. Increased pulmonary vascularity, such that mild or developing interstitial edema is difficult to exclude. No pleural effusion identified. 4. Mild cardiomegaly. Electronically Signed   By: Genevie Ann M.D.   On: 12/30/2016 17:11   Ct Chest Wo Contrast  Result Date: 12/31/2016 CLINICAL DATA:  Shortness of breath. EXAM: CT CHEST WITHOUT CONTRAST TECHNIQUE: Multidetector CT imaging of the chest was performed following the standard protocol without IV contrast. COMPARISON:  Chest radiographs yesterday. FINDINGS: Cardiovascular: Mild multi chamber cardiomegaly. There are coronary artery calcifications. Enlargement of the main pulmonary artery measuring 4.6 cm. Mild atherosclerosis of the thoracic aorta. Mediastinum/Nodes: Probable upper paratracheal node measures 16 mm. This is contiguous with lobular low-density that may reflect a low-density lymph node versus fluid in the superior pericardial recess. Additional smaller mediastinal nodes are not enlarged by size criteria. There is no bulky hilar adenopathy, lack of IV contrast limits assessment of the hila. The esophagus is nondistended. Visualized thyroid gland is normal. No axillary adenopathy. Lungs/Pleura: Small Bochdalek hernia on the left containing fat. Tiny subpleural 4 mm nodule in the right lower lobe image 125 series 4. Minimal scattered subpleural and perifissural atelectasis in both lungs. No consolidation. No evidence pulmonary edema. No pleural effusion. Trachea and mainstem bronchi are patent. Upper Abdomen: Incidental colonic diverticulosis. No acute abnormality. Musculoskeletal: Bulky anterior osteophytes in the midthoracic spine. There are no acute or suspicious osseous abnormalities. IMPRESSION: 1. Multi chamber cardiomegaly. There are coronary artery calcifications. Mild aortic atherosclerosis. 2. Enlargement of the main pulmonary artery (4.6 cm) consistent with pulmonary arterial hypertension. Right hilar prominence on prior radiograph  is likely secondary to enlarged pulmonary artery. No bulky adenopathy, however or hilar evaluation is limited in the absence of IV contrast. 3. Prominent paratracheal lymph nodes versus fluid in the superior pericardial recess. Recommend clinical consultation and 3-6 month follow-up CT. 4. Tiny subpleural 4 mm right lower lobe nodule. This is likely an intrapulmonary lymph node, however no prior exams available for comparison. No follow-up needed if patient is low-risk. Non-contrast chest CT can be considered in 12 months if patient is high-risk. This recommendation follows the consensus statement: Guidelines for Management of Incidental Pulmonary Nodules Detected on CT Images: From the Fleischner Society 2017; Radiology 2017;  [id number]. Aortic Atherosclerosis (ICD10-I70.0). Electronically Signed   By: Jeb Levering M.D.   On: 12/31/2016 00:58    Procedures Procedures (including critical care time)  Medications Ordered in ED Medications  albuterol (PROVENTIL) (2.5 MG/3ML) 0.083% nebulizer solution 5 mg (not administered)  furosemide (LASIX) injection 80 mg (80 mg Intravenous Given 12/31/16 0115)     Initial Impression / Assessment and Plan / ED Course  I have reviewed the triage vital signs and the nursing notes.  Pertinent labs & imaging results that were available during my care of the patient were reviewed by me and considered in my medical decision making (see chart for details).     Patient presents with shortness of breath, weight gain, lower extremity edema.  These seem to be acute on chronic issues.  Although he does report worsening shortness of breath and somnolence over the last several days.  He is on room air satting in the low 90s.  No acute distress.  He does appear volume overloaded markedly.  Workup initiated.  No baseline labs available.  Creatinine in the threes.  Patient reports history of "kidney problems."  Unknown baseline creatinine.  Chest x-ray shows a hilar mass.   CT scan concerning for pulmonary serial hypertension.  Some edema noted as well.  Patient got hypoxic and tachycardic with ambulation.  Will admit to the hospitalist.  Presumed CHF.  Patient will need diuresis and echo.  He was given 80 mg of IV Lasix.  Final Clinical Impressions(s) / ED Diagnoses   Final diagnoses:  Shortness of breath  Hypoxia  Lower extremity edema  Kidney disease    ED Discharge Orders    None       Merryl Hacker, MD 12/31/16 [phone]

## 2016-12-31 NOTE — Progress Notes (Signed)
Pt placed on CPAP for the night with full face mask.  Pt tolerating well.  Will monitor progress.

## 2016-12-31 NOTE — ED Notes (Signed)
[facility] lunch tray ordered at 1031

## 2017-01-01 ENCOUNTER — Inpatient Hospital Stay (HOSPITAL_COMMUNITY): Payer: Medicare Other

## 2017-01-01 DIAGNOSIS — I4891 Unspecified atrial fibrillation: Secondary | ICD-10-CM

## 2017-01-01 LAB — BASIC METABOLIC PANEL
ANION GAP: 8 (ref 5–15)
BUN: 33 mg/dL — AB (ref 6–20)
CALCIUM: 8.5 mg/dL — AB (ref 8.9–10.3)
CO2: 31 mmol/L (ref 22–32)
Chloride: 101 mmol/L (ref 101–111)
Creatinine, Ser: 3.21 mg/dL — ABNORMAL HIGH (ref 0.61–1.24)
GFR calc Af Amer: 21 mL/min — ABNORMAL LOW (ref >60)
GFR, EST NON AFRICAN AMERICAN: 18 mL/min — AB (ref >60)
Glucose, Bld: 190 mg/dL — ABNORMAL HIGH (ref 65–99)
POTASSIUM: 3.5 mmol/L (ref 3.5–5.1)
SODIUM: 140 mmol/L (ref 135–145)

## 2017-01-01 LAB — PROTIME-INR
INR: 3.44
PROTHROMBIN TIME: 34.4 s — AB (ref 11.4–15.2)

## 2017-01-01 LAB — PHOSPHORUS: PHOSPHORUS: 3.7 mg/dL (ref 2.5–4.6)

## 2017-01-01 LAB — MAGNESIUM: MAGNESIUM: 1.9 mg/dL (ref 1.7–2.4)

## 2017-01-01 MED ORDER — POTASSIUM CHLORIDE CRYS ER 20 MEQ PO TBCR
40.0000 meq | EXTENDED_RELEASE_TABLET | Freq: Once | ORAL | Status: AC
  Administered 2017-01-01: 40 meq via ORAL
  Filled 2017-01-01: qty 2

## 2017-01-01 MED ORDER — ISOSORB DINITRATE-HYDRALAZINE 20-37.5 MG PO TABS
1.0000 | ORAL_TABLET | Freq: Three times a day (TID) | ORAL | Status: DC
  Administered 2017-01-01 – 2017-01-04 (×9): 1 via ORAL
  Filled 2017-01-01 (×9): qty 1

## 2017-01-01 NOTE — Progress Notes (Addendum)
PROGRESS NOTE    Mark Adkins  MWN:[id number] DOB: 10/28/1945 DOA: 12/30/2016 PCP: [facility]    Brief Narrative:   71 y.o. male with medical history significant for atrial fibrillation/flutter on warfarin, chronic CHF, chronic kidney disease stage IV, hypertension, and OSA on CPAP, now presenting to the emergency department for evaluation of progressive shortness of breath, orthopnea, and swelling.   Assessment & Plan:   Principal Problem:   CHF, acute on chronic (HCC) - Pt has lost 5 Liters - Continue IV lasix for one more day then transition to oral lasix next am.  Active Problems:   Hypertension - Pt is currently on amlodipine, labetalol. Still not well controlled as such will place no Bidil    Atrial fibrillation (HCC) - Rate controlled on B blocker - Pt is on Warfarin for anticoagulation. INR supratherapeutic but trending down (still > 3 on last check) agree with holding warfarin one more day  Generalized weakness - Be secondary to principal problem and shortness of breath as such suspect patient was sedentary. Obtaining physical therapy evaluation     Sleep apnea - continue sleep apnea    CKD (chronic kidney disease), stage IV ([place]) - stable currently. 3.2 on last check.    Normocytic anemia    Mediastinal abnormality - lymph node enlargement see CT scan results    Lung nodule - will need outpatient monitoring.  DVT prophylaxis: Pt on warfarin Code Status: Full Family Communication: Discussed directly with patient Disposition Plan: Pending improvement in respiratory condition. Most likely discharge next a.m. with improvement in condition. Obtaining physical therapy evaluation secondary to complaints of generalized weakness   Consultants:   none   Procedures: none   Antimicrobials: None   Subjective: Pt complaining of generalized weakness and requested PT evaluation  Objective: Vitals:   01/01/17 0647 01/01/17 0936 01/01/17 1204 01/01/17  1637  BP: (!) 162/78 (!) 152/94 (!) 172/87 (!) 177/94  Pulse: 68 76 65 88  Resp:      Temp:      TempSrc:      SpO2:  98% 95% 96%  Weight:      Height:        Intake/Output Summary (Last 24 hours) at 01/01/2017 1647 Last data filed at 01/01/2017 1332 Gross per 24 hour  Intake 363 ml  Output 3125 ml  Net -2762 ml   Filed Weights   12/30/16 1617 12/31/16 1805 01/01/17 0401  Weight: (!) 150.6 kg (332 lb) (!) 147.8 kg (325 lb 12.8 oz) (!) 146.6 kg (323 lb 3.2 oz)    Examination:  General exam: Appears calm and comfortable, in nad. Respiratory system: Clear to auscultation. Respiratory effort normal. Cardiovascular system: S1 & S2 heard, RRR. No JVD, murmurs, rubs, gallops  Gastrointestinal system: Abdomen is nondistended, soft and nontender. No organomegaly or masses felt. Normal bowel sounds heard. Central nervous system: Alert and oriented. No focal neurological deficits. Extremities: warm no deformities Skin: No rashes, lesions or ulcers, on limited exam. Psychiatry:  Mood & affect appropriate.   Data Reviewed: I have personally reviewed following labs and imaging studies  CBC: Recent Labs  Lab 12/30/16 1629  WBC 4.0  HGB 10.7*  HCT 35.2*  MCV 99.7  PLT 403   Basic Metabolic Panel: Recent Labs  Lab 12/30/16 1629 01/01/17 0839  NA 139 140  K 4.2 3.5  CL 104 101  CO2 27 31  GLUCOSE 147* 190*  BUN 35* 33*  CREATININE 3.28* 3.21*  CALCIUM 8.5* 8.5*  GFR: Estimated Creatinine Clearance: 31.9 mL/min (A) (by C-G formula based on SCr of 3.21 mg/dL (H)). Liver Function Tests: No results for input(s): AST, ALT, ALKPHOS, BILITOT, PROT, ALBUMIN in the last 168 hours. No results for input(s): LIPASE, AMYLASE in the last 168 hours. No results for input(s): AMMONIA in the last 168 hours. Coagulation Profile: Recent Labs  Lab 12/31/16 0052 01/01/17 0839  INR 4.24* 3.44   Cardiac Enzymes: Recent Labs  Lab 12/31/16 0052  TROPONINI <0.03   BNP (last 3  results) No results for input(s): PROBNP in the last 8760 hours. HbA1C: No results for input(s): HGBA1C in the last 72 hours. CBG: No results for input(s): GLUCAP in the last 168 hours. Lipid Profile: No results for input(s): CHOL, HDL, LDLCALC, TRIG, CHOLHDL, LDLDIRECT in the last 72 hours. Thyroid Function Tests: No results for input(s): TSH, T4TOTAL, FREET4, T3FREE, THYROIDAB in the last 72 hours. Anemia Panel: No results for input(s): VITAMINB12, FOLATE, FERRITIN, TIBC, IRON, RETICCTPCT in the last 72 hours. Sepsis Labs: No results for input(s): PROCALCITON, LATICACIDVEN in the last 168 hours.  No results found for this or any previous visit (from the past 240 hour(s)).       Radiology Studies: Dg Chest 2 View  Result Date: 12/30/2016 CLINICAL DATA:  71 year old male with increasing shortness of breath with exertion. Chronic cough. EXAM: CHEST  2 VIEW COMPARISON:  None. FINDINGS: Cardiac size is at the upper limits of normal to mildly enlarged. There is mild tortuosity of the thoracic aorta. There is prominent central pulmonary vasculature, and an asymmetrically rounded appearance of the right hilum on the frontal view. Questionable hilar enlargement on the lateral view (3-3.5 cm diameter). Randolm Idol* ED Delice Lesch a mother Visualized tracheal air column is within normal limits. No pneumothorax, pleural effusion or confluent pulmonary opacity. Increased pulmonary vascularity, but no minimal if any pleural fluid in the fissures. Negative visible bowel gas pattern. No acute osseous abnormality identified. IMPRESSION: 1. No prior study for comparison. 2. Questionable abnormal right hilar enlargement. CT Chest with IV contrast would evaluate further. 3. Increased pulmonary vascularity, such that mild or developing interstitial edema is difficult to exclude. No pleural effusion identified. 4. Mild cardiomegaly. Electronically Signed   By: Genevie Ann M.D.   On: 12/30/2016 17:11   Ct Chest Wo  Contrast  Result Date: 12/31/2016 CLINICAL DATA:  Shortness of breath. EXAM: CT CHEST WITHOUT CONTRAST TECHNIQUE: Multidetector CT imaging of the chest was performed following the standard protocol without IV contrast. COMPARISON:  Chest radiographs yesterday. FINDINGS: Cardiovascular: Mild multi chamber cardiomegaly. There are coronary artery calcifications. Enlargement of the main pulmonary artery measuring 4.6 cm. Mild atherosclerosis of the thoracic aorta. Mediastinum/Nodes: Probable upper paratracheal node measures 16 mm. This is contiguous with lobular low-density that may reflect a low-density lymph node versus fluid in the superior pericardial recess. Additional smaller mediastinal nodes are not enlarged by size criteria. There is no bulky hilar adenopathy, lack of IV contrast limits assessment of the hila. The esophagus is nondistended. Visualized thyroid gland is normal. No axillary adenopathy. Lungs/Pleura: Small Bochdalek hernia on the left containing fat. Tiny subpleural 4 mm nodule in the right lower lobe image 125 series 4. Minimal scattered subpleural and perifissural atelectasis in both lungs. No consolidation. No evidence pulmonary edema. No pleural effusion. Trachea and mainstem bronchi are patent. Upper Abdomen: Incidental colonic diverticulosis. No acute abnormality. Musculoskeletal: Bulky anterior osteophytes in the midthoracic spine. There are no acute or suspicious osseous abnormalities. IMPRESSION: 1. Multi chamber cardiomegaly.  There are coronary artery calcifications. Mild aortic atherosclerosis. 2. Enlargement of the main pulmonary artery (4.6 cm) consistent with pulmonary arterial hypertension. Right hilar prominence on prior radiograph is likely secondary to enlarged pulmonary artery. No bulky adenopathy, however or hilar evaluation is limited in the absence of IV contrast. 3. Prominent paratracheal lymph nodes versus fluid in the superior pericardial recess. Recommend clinical  consultation and 3-6 month follow-up CT. 4. Tiny subpleural 4 mm right lower lobe nodule. This is likely an intrapulmonary lymph node, however no prior exams available for comparison. No follow-up needed if patient is low-risk. Non-contrast chest CT can be considered in 12 months if patient is high-risk. This recommendation follows the consensus statement: Guidelines for Management of Incidental Pulmonary Nodules Detected on CT Images: From the Fleischner Society 2017; Radiology 2017; 284:228-243. Aortic Atherosclerosis (ICD10-I70.0). Electronically Signed   By: Jeb Levering M.D.   On: 12/31/2016 00:58     Scheduled Meds: . amLODipine  10 mg Oral Daily  . B-complex with vitamin C  1 tablet Oral Daily  . cholecalciferol  2,000 Units Oral Daily  . docusate sodium  100 mg Oral BID  . furosemide  40 mg Intravenous BID  . labetalol  300 mg Oral BID  . pravastatin  40 mg Oral QHS  . sodium chloride flush  3 mL Intravenous Q12H  . tamsulosin  0.4 mg Oral QPC supper  . zinc sulfate  220 mg Oral Daily   Continuous Infusions: . sodium chloride       LOS: 1 day    Time spent: > 35 minutes  Velvet Bathe, MD [facility] Pager [phone]  If 7PM-7AM, please contact night-coverage [email or web address] Password [facility] 01/01/2017, 4:47 PM   Addendum: Contacted by nurse about nonsustained V. tach 10 beats. As such will check magnesium and phosphorus levels. Potassium level of 3.5 we'll go ahead and minister KDUR 40 mg 1. Patient is on beta blocker currently and was completely asymptomatic. Echocardiogram results pending

## 2017-01-01 NOTE — Evaluation (Signed)
Physical Therapy Evaluation Patient Details Name: Mark Adkins MRN: [id number] DOB: 30-Jan-1945 Today's Date: 01/01/2017   History of Present Illness  Pt is a 71 y.o. male presenting to ED on 12/30/16 with worsening SOB, orthopnea, and swelling; worked up for CHF exacerbation. Pertinent PMH includes a-fib/flutter on warfarin, chronic CHF, CKD IV, HTN, OSA on CPAP.     Clinical Impression  Pt presents with an overall decrease in functional mobility secondary to above. PTA, pt indep and lives at home with wife. Today, able to stand and take steps with RW and min guard; limited by significant bilat foot pain upon standing which pt attributed to feeling like a "gout flare-up." Feel that pt will be moving well once this is under control; states he has been able to amb to/from bathroom using RW earlier today. Pt would benefit from continued acute PT services to maximize functional mobility and independence prior to d/c with HHPT services.     Follow Up Recommendations Home health PT;Supervision - Intermittent    Equipment Recommendations  None recommended by PT    Recommendations for Other Services       Precautions / Restrictions Precautions Precautions: Fall Restrictions Weight Bearing Restrictions: No      Mobility  Bed Mobility               General bed mobility comments: Received sitting in chair  Transfers Overall transfer level: Needs assistance Equipment used: Rolling walker (2 wheeled) Transfers: Sit to/from Stand Sit to Stand: Supervision         General transfer comment: Pt with increased difficulty coming to standing from recliner, reliant on momentum and BUEs to power into standing. C/o of significant bilat feet pain (R>L) upon standing which felt like "gout flare-up"  Ambulation/Gait Ambulation/Gait assistance: Min guard Ambulation Distance (Feet): 2 Feet Assistive device: Rolling walker (2 wheeled) Gait Pattern/deviations: Step-to pattern;Wide base of  support;Antalgic     General Gait Details: Pt able to take a few steps forward with RW; significant antalgic gait with c/o "gout flare-up" type pain, requesting to return to sitting. According to pt, was able to amb to/from bathroom with RW earlier today  Stairs            Wheelchair Mobility    Modified Rankin (Stroke Patients Only)       Balance Overall balance assessment: Needs assistance   Sitting balance-Leahy Scale: Good       Standing balance-Leahy Scale: Poor Standing balance comment: Reliant on BUE support (potentially due to pain)                             Pertinent Vitals/Pain Pain Assessment: Faces Faces Pain Scale: Hurts even more Pain Location: Bilat feet (R>L) when attempting to stand Pain Descriptors / Indicators: Aching;Discomfort;Guarding;Grimacing Pain Intervention(s): Limited activity within patient's tolerance;Monitored during session    Home Living Family/patient expects to be discharged to:: Private residence Living Arrangements: Spouse/significant other Available Help at Discharge: Family;Available 24 hours/day Type of Home: House Home Access: Stairs to enter Entrance Stairs-Rails: None Entrance Stairs-Number of Steps: 2 thresholds Home Layout: One level Home Equipment: Walker - 2 wheels;Cane - quad;Grab bars - tub/shower;Grab bars - toilet      Prior Function Level of Independence: Independent               Hand Dominance        Extremity/Trunk Assessment   Upper Extremity Assessment Upper Extremity Assessment: Overall [facility]  for tasks assessed    Lower Extremity Assessment Lower Extremity Assessment: Generalized weakness       Communication   Communication: No difficulties  Cognition Arousal/Alertness: Awake/alert Behavior During Therapy: WFL for tasks assessed/performed Overall Cognitive Status: Within Functional Limits for tasks assessed                                         General Comments      Exercises     Assessment/Plan    PT Assessment Patient needs continued PT services  PT Problem List Decreased strength;Decreased activity tolerance;Decreased balance;Decreased mobility;Pain       PT Treatment Interventions DME instruction;Gait training;Stair training;Functional mobility training;Therapeutic activities;Therapeutic exercise;Balance training;Patient/family education    PT Goals (Current goals can be found in the Care Plan section)  Acute Rehab PT Goals Patient Stated Goal: Decreased pain PT Goal Formulation: With patient Time For Goal Achievement: 01/15/17 Potential to Achieve Goals: Good    Frequency Min 3X/week   Barriers to discharge        Co-evaluation               AM-PAC PT "6 Clicks" Daily Activity  Outcome Measure Difficulty turning over in bed (including adjusting bedclothes, sheets and blankets)?: None Difficulty moving from lying on back to sitting on the side of the bed? : None Difficulty sitting down on and standing up from a chair with arms (e.g., wheelchair, bedside commode, etc,.)?: A Little Help needed moving to and from a bed to chair (including a wheelchair)?: A Little Help needed walking in hospital room?: A Little Help needed climbing 3-5 steps with a railing? : A Lot 6 Click Score: 19    End of Session Equipment Utilized During Treatment: Gait belt Activity Tolerance: Patient limited by pain Patient left: in chair;with call bell/phone within reach Nurse Communication: Mobility status PT Visit Diagnosis: Other abnormalities of gait and mobility (R26.89);Pain Pain - part of body: Ankle and joints of foot    Time: 1710-1723 PT Time Calculation (min) (ACUTE ONLY): 13 min   Charges:   PT Evaluation $PT Eval Moderate Complexity: 1 Mod     PT G Codes:       Mabeline Caras, PT, DPT Acute Rehab Services  Pager: [place] 01/01/2017, 5:45 PM

## 2017-01-01 NOTE — Progress Notes (Signed)
Notified Dr Wendee Beavers Pt had 10 beat run of Vtach. Pt is asymptomatic sitting comfortably in chair eating dinner.

## 2017-01-02 ENCOUNTER — Inpatient Hospital Stay (HOSPITAL_COMMUNITY): Payer: Medicare Other

## 2017-01-02 DIAGNOSIS — I5033 Acute on chronic diastolic (congestive) heart failure: Secondary | ICD-10-CM

## 2017-01-02 DIAGNOSIS — I361 Nonrheumatic tricuspid (valve) insufficiency: Secondary | ICD-10-CM

## 2017-01-02 LAB — BASIC METABOLIC PANEL
Anion gap: 8 (ref 5–15)
BUN: 34 mg/dL — AB (ref 6–20)
CHLORIDE: 103 mmol/L (ref 101–111)
CO2: 30 mmol/L (ref 22–32)
Calcium: 8.6 mg/dL — ABNORMAL LOW (ref 8.9–10.3)
Creatinine, Ser: 3.27 mg/dL — ABNORMAL HIGH (ref 0.61–1.24)
GFR calc Af Amer: 21 mL/min — ABNORMAL LOW (ref >60)
GFR calc non Af Amer: 18 mL/min — ABNORMAL LOW (ref >60)
GLUCOSE: 120 mg/dL — AB (ref 65–99)
POTASSIUM: 3.7 mmol/L (ref 3.5–5.1)
Sodium: 141 mmol/L (ref 135–145)

## 2017-01-02 LAB — PROTIME-INR
INR: 2.36
Prothrombin Time: 25.6 seconds — ABNORMAL HIGH (ref 11.4–15.2)

## 2017-01-02 LAB — ECHOCARDIOGRAM COMPLETE
HEIGHTINCHES: 71 in
WEIGHTICAEL: 5083.2 [oz_av]

## 2017-01-02 MED ORDER — WARFARIN SODIUM 5 MG PO TABS
5.0000 mg | ORAL_TABLET | Freq: Once | ORAL | Status: AC
  Administered 2017-01-02: 5 mg via ORAL
  Filled 2017-01-02: qty 1

## 2017-01-02 MED ORDER — WARFARIN - PHARMACIST DOSING INPATIENT: Freq: Every day | Status: DC

## 2017-01-02 MED ORDER — COLCHICINE 0.6 MG PO TABS
0.6000 mg | ORAL_TABLET | Freq: Every day | ORAL | Status: DC
  Administered 2017-01-03 – 2017-01-04 (×2): 0.6 mg via ORAL
  Filled 2017-01-02 (×2): qty 1

## 2017-01-02 MED ORDER — COLCHICINE 0.6 MG PO TABS
1.2000 mg | ORAL_TABLET | Freq: Once | ORAL | Status: AC
  Administered 2017-01-02: 1.2 mg via ORAL
  Filled 2017-01-02: qty 2

## 2017-01-02 MED ORDER — ISOSORB DINITRATE-HYDRALAZINE 20-37.5 MG PO TABS: 1.0000 | ORAL_TABLET | Freq: Three times a day (TID) | ORAL | 0 refills | Status: DC

## 2017-01-02 MED ORDER — FUROSEMIDE 40 MG PO TABS
40.0000 mg | ORAL_TABLET | Freq: Two times a day (BID) | ORAL | Status: DC
  Administered 2017-01-02 – 2017-01-03 (×4): 40 mg via ORAL
  Filled 2017-01-02 (×4): qty 1

## 2017-01-02 NOTE — Progress Notes (Signed)
Asked to dose colchicine. PT note mentions patient complaining of gout flare up.  Also, warfarin has been on hold due to elevated INR. INR down to 2.36 this morning. Home dose of warfarin 7.5 mg MWFSun, 5 mg TThSat  Plan: -Colchicine 1.2mg  po x1, then 0.6mg  daily until flare resolves -Follow plans for anticoagulation- recommend restarting warfarin at reduced dose of 5mg  daily except 7.5mg  on Wednesdays with INR check 12/26.  Riker Collier D. Shalon Councilman, PharmD, BCPS Clinical Pharmacist Clinical Phone for 01/02/2017 until 3:30pm: [phone] If after 3:30pm, please call main pharmacy at [phone] 01/02/2017 1:19 PM

## 2017-01-02 NOTE — Progress Notes (Signed)
ANTICOAGULATION CONSULT NOTE - Initial Consult  Pharmacy Consult:  Coumadin Indication:  Aflutter  No Known Allergies  Patient Measurements: Height: 5\' 11"  (180.3 cm) Weight: (!) 317 lb 11.2 oz (144.1 kg) IBW/kg (Calculated) : 75.3  Vital Signs: Temp: 99.1 F (37.3 C) (12/21 1234) Temp Source: Oral (12/21 1234) BP: 123/84 (12/21 1135) Pulse Rate: 111 (12/21 1135)  Labs: Recent Labs    12/31/16 0052 01/01/17 0839 01/02/17 0628  LABPROT 40.2* 34.4* 25.6*  INR 4.24* 3.44 2.36  CREATININE  --  3.21* 3.27*  TROPONINI <0.03  --   --     Estimated Creatinine Clearance: 31 mL/min (A) (by C-G formula based on SCr of 3.27 mg/dL (H)).   Medical History: Past Medical History:  Diagnosis Date  . Arthritis    "knees, right elbow" (12/31/2016)  . Atrial fibrillation ([place])   . CHF (congestive heart failure) ([place]) 12/31/2016  . CKD (chronic kidney disease), stage IV ([place])   . Depression   . Gout   . History of kidney stones   . Hypercholesterolemia   . Hypertension   . OSA on CPAP   . Type 2 diabetes, diet controlled ([place])      Assessment: 87 YOM with history of Aflutter presented with elevated INR and Coumadin was placed on hold since 12/30/16.  INR trended down to therapeutic level today and Pharmacy received consult to resume Coumadin.  No bleeding reported.  Home Coumadin dose: 7.5mg  PO daily except 5mg  on TTS   Goal of Therapy:  INR 2-3 Monitor platelets by anticoagulation protocol: Yes    Plan:  Coumadin 5mg  PO today Daily PT / INR   Yamel Bale D. Mina Marble, PharmD, BCPS Pager:  [phone] 01/02/2017, 4:35 PM

## 2017-01-02 NOTE — Discharge Summary (Addendum)
Physician Discharge Summary  Mark Adkins EPP:[id number] DOB: Sep 19, 1945 DOA: 12/30/2016  PCP: [facility]  Admit date: 12/30/2016 Discharge date: 01/02/2017  Time spent: > 35 minutes  Recommendations for Outpatient Follow-up:  1. Ensure patient follows up with cardiologist. Encourage compliance with diet 2. Pt with lung nodules on imaging scans please see CT scan results below for details. Will need further work up and monitoring.   Discharge Diagnoses:  Principal Problem:   CHF, acute on chronic [facility]) Active Problems:   Hypertension   Atrial fibrillation (HCC)   Sleep apnea   CKD (chronic kidney disease), stage IV (HCC)   Normocytic anemia   Mediastinal abnormality   Lung nodule   Discharge Condition: Stable  Diet recommendation: Heart healthy  Filed Weights   12/31/16 1805 01/01/17 0401 01/02/17 0523  Weight: (!) 147.8 kg (325 lb 12.8 oz) (!) 146.6 kg (323 lb 3.2 oz) (!) 144.1 kg (317 lb 11.2 oz)    History of present illness:  71 y.o.malewith medical history significant foratrial fibrillation/flutter on warfarin, chronic CHF, chronic kidney disease stage IV, hypertension, and OSA on CPAP, now presenting to the emergency department for evaluation of progressive shortness of breath, orthopnea, and swelling.  Hospital Course:  Principal Problem:   CHF, acute on chronic (HCC) diastolic HF - Pt has lost 5 Liters - Patient back at baseline we'll continue home Lasix regimen  Active Problems:   Hypertension - Continue prior to admission medications regimen and will add BiDil on discharge    Atrial fibrillation ([place]) - Rate controlled on B blocker - Pt is on Warfarin for anticoagulation. Pt to continue routine regimen.  Generalized weakness - PT evaluated in house and recommending home health PT.     Sleep apnea - continue sleep apnea    CKD (chronic kidney disease), stage IV (HCC) - stable currently. 3.2 on last check.    Normocytic anemia     Mediastinal abnormality - lymph node enlargement see CT scan results - outpatient monitoring.    Lung nodule - will need outpatient monitoring.   Procedures:  None  Consultations:  None  Discharge Exam: Vitals:   01/02/17 1234 01/02/17 1637  BP:  135/64  Pulse:  96  Resp:  19  Temp: 99.1 F (37.3 C) 99 F (37.2 C)  SpO2:  95%    General: Pt in nad, alert and awake Cardiovascular: s1 and s2 present, no rubs Respiratory: no increased wob, no wheezes, equal chest rise.  Discharge Instructions   Discharge Instructions    Diet - low sodium heart healthy   Complete by:  As directed    Increase activity slowly   Complete by:  As directed      Allergies as of 01/02/2017   No Known Allergies     Medication List    TAKE these medications   acetaminophen 500 MG tablet Commonly known as:  TYLENOL Take 1,000 mg by mouth 2 (two) times daily.   amLODipine 10 MG tablet Commonly known as:  NORVASC Take 10 mg by mouth daily.   b complex vitamins capsule Take 1 capsule by mouth daily.   docusate sodium 100 MG capsule Commonly known as:  COLACE Take 100 mg by mouth 2 (two) times daily.   fluticasone 50 MCG/ACT nasal spray Commonly known as:  FLONASE Place 2 sprays into both nostrils daily as needed for allergies or rhinitis.   furosemide 40 MG tablet Commonly known as:  LASIX Take 40 mg by mouth 2 (two) times  daily.   isosorbide-hydrALAZINE 20-37.5 MG tablet Commonly known as:  BIDIL Take 1 tablet by mouth 3 (three) times daily.   labetalol 300 MG tablet Commonly known as:  NORMODYNE Take 300 mg by mouth 2 (two) times daily.   nitroGLYCERIN 0.4 MG SL tablet Commonly known as:  NITROSTAT Place 0.4 mg under the tongue every 5 (five) minutes as needed for chest pain.   pravastatin 40 MG tablet Commonly known as:  PRAVACHOL Take 40 mg by mouth at bedtime.   tamsulosin 0.4 MG Caps capsule Commonly known as:  FLOMAX Take 0.4 mg by mouth daily after  supper.   vitamin C with rose hips 1000 MG tablet Take 1,000 mg by mouth daily.   Vitamin D3 2000 units capsule Take 2,000 Units by mouth daily.   vitamin E 1000 UNIT capsule Take 1,000 Units by mouth daily.   warfarin 5 MG tablet Commonly known as:  COUMADIN Take 5 mg by mouth See admin instructions. Take 7.5 mg  On Sun. Mon, Wed and Friday Take 10 mg tues, thurs, and saturday   zinc sulfate 220 (50 Zn) MG capsule Take 220 mg by mouth daily.      No Known Allergies Follow-up Information    [facility] Follow up.   Specialty:  Home Health Services Why:  Home health services (PT) were arranged, they will call you to set up home visits  Contact information: [address] [phone]            The results of significant diagnostics from this hospitalization (including imaging, microbiology, ancillary and laboratory) are listed below for reference.    Significant Diagnostic Studies: Dg Chest 2 View  Result Date: 12/30/2016 CLINICAL DATA:  71 year old male with increasing shortness of breath with exertion. Chronic cough. EXAM: CHEST  2 VIEW COMPARISON:  None. FINDINGS: Cardiac size is at the upper limits of normal to mildly enlarged. There is mild tortuosity of the thoracic aorta. There is prominent central pulmonary vasculature, and an asymmetrically rounded appearance of the right hilum on the frontal view. Questionable hilar enlargement on the lateral view (3-3.5 cm diameter). Randolm Idol* ED Delice Lesch a mother Visualized tracheal air column is within normal limits. No pneumothorax, pleural effusion or confluent pulmonary opacity. Increased pulmonary vascularity, but no minimal if any pleural fluid in the fissures. Negative visible bowel gas pattern. No acute osseous abnormality identified. IMPRESSION: 1. No prior study for comparison. 2. Questionable abnormal right hilar enlargement. CT Chest with IV contrast would evaluate further. 3.  Increased pulmonary vascularity, such that mild or developing interstitial edema is difficult to exclude. No pleural effusion identified. 4. Mild cardiomegaly. Electronically Signed   By: Genevie Ann M.D.   On: 12/30/2016 17:11   Ct Chest Wo Contrast  Result Date: 12/31/2016 CLINICAL DATA:  Shortness of breath. EXAM: CT CHEST WITHOUT CONTRAST TECHNIQUE: Multidetector CT imaging of the chest was performed following the standard protocol without IV contrast. COMPARISON:  Chest radiographs yesterday. FINDINGS: Cardiovascular: Mild multi chamber cardiomegaly. There are coronary artery calcifications. Enlargement of the main pulmonary artery measuring 4.6 cm. Mild atherosclerosis of the thoracic aorta. Mediastinum/Nodes: Probable upper paratracheal node measures 16 mm. This is contiguous with lobular low-density that may reflect a low-density lymph node versus fluid in the superior pericardial recess. Additional smaller mediastinal nodes are not enlarged by size criteria. There is no bulky hilar adenopathy, lack of IV contrast limits assessment of the hila. The esophagus is nondistended. Visualized thyroid  gland is normal. No axillary adenopathy. Lungs/Pleura: Small Bochdalek hernia on the left containing fat. Tiny subpleural 4 mm nodule in the right lower lobe image 125 series 4. Minimal scattered subpleural and perifissural atelectasis in both lungs. No consolidation. No evidence pulmonary edema. No pleural effusion. Trachea and mainstem bronchi are patent. Upper Abdomen: Incidental colonic diverticulosis. No acute abnormality. Musculoskeletal: Bulky anterior osteophytes in the midthoracic spine. There are no acute or suspicious osseous abnormalities. IMPRESSION: 1. Multi chamber cardiomegaly. There are coronary artery calcifications. Mild aortic atherosclerosis. 2. Enlargement of the main pulmonary artery (4.6 cm) consistent with pulmonary arterial hypertension. Right hilar prominence on prior radiograph is likely  secondary to enlarged pulmonary artery. No bulky adenopathy, however or hilar evaluation is limited in the absence of IV contrast. 3. Prominent paratracheal lymph nodes versus fluid in the superior pericardial recess. Recommend clinical consultation and 3-6 month follow-up CT. 4. Tiny subpleural 4 mm right lower lobe nodule. This is likely an intrapulmonary lymph node, however no prior exams available for comparison. No follow-up needed if patient is low-risk. Non-contrast chest CT can be considered in 12 months if patient is high-risk. This recommendation follows the consensus statement: Guidelines for Management of Incidental Pulmonary Nodules Detected on CT Images: From the Fleischner Society 2017; Radiology 2017; 284:228-243. Aortic Atherosclerosis (ICD10-I70.0). Electronically Signed   By: Jeb Levering M.D.   On: 12/31/2016 00:58    Microbiology: No results found for this or any previous visit (from the past 240 hour(s)).   Labs: Basic Metabolic Panel: Recent Labs  Lab 12/30/16 1629 01/01/17 0839 01/01/17 1812 01/02/17 0628  NA 139 140  --  141  K 4.2 3.5  --  3.7  CL 104 101  --  103  CO2 27 31  --  30  GLUCOSE 147* 190*  --  120*  BUN 35* 33*  --  34*  CREATININE 3.28* 3.21*  --  3.27*  CALCIUM 8.5* 8.5*  --  8.6*  MG  --   --  1.9  --   PHOS  --   --  3.7  --    Liver Function Tests: No results for input(s): AST, ALT, ALKPHOS, BILITOT, PROT, ALBUMIN in the last 168 hours. No results for input(s): LIPASE, AMYLASE in the last 168 hours. No results for input(s): AMMONIA in the last 168 hours. CBC: Recent Labs  Lab 12/30/16 1629  WBC 4.0  HGB 10.7*  HCT 35.2*  MCV 99.7  PLT 218   Cardiac Enzymes: Recent Labs  Lab 12/31/16 0052  TROPONINI <0.03   BNP: BNP (last 3 results) Recent Labs    12/30/16 1629  BNP 259.1*    ProBNP (last 3 results) No results for input(s): PROBNP in the last 8760 hours.  CBG: No results for input(s): GLUCAP in the last 168  hours.     Signed:  Velvet Bathe MD.  [facility] 01/02/2017, 5:51 PM

## 2017-01-02 NOTE — Progress Notes (Signed)
  Echocardiogram 2D Echocardiogram has been performed.  Jennette Dubin 01/02/2017, 3:02 PM

## 2017-01-02 NOTE — Progress Notes (Signed)
Pt placed on CPAP for the night.  Tolerating well. 

## 2017-01-02 NOTE — Care Management Note (Addendum)
Case Management Note  Patient Details  Name: Mark Adkins MRN: [id number] Date of Birth: 11/26/1945  Subjective/Objective:   Admitted for CHF              Action/Plan:   Prior to admission lived at home with wife.  In to speak with patient, states [facility] Du[facility].  Home DME equipment:  Rolling walker, Cane, CPAP.  Wife does the cooking.  Denies inability to afford medications or food. Orders placed for [facility] PT.  Discussed recommendations made for [facility] RN/PT, offered choice for [place].  [facility] selected [facility].Referral for [facility] RN/PT called to Tim with [facility].NCM will follow for this and arrange if needed.    Expected Discharge Date:   Possibly today.               Expected Discharge Plan: Home with Home Health. HH Arranged:  Therapist, sports, PT Home Health: [facility] Discharge planning Services  CM Consult. Status of Service:  Completed.  Kristen Cardinal, RN  Case Manager-Orientation [phone]/21/2018, 11:26 AM

## 2017-01-03 DIAGNOSIS — R0902 Hypoxemia: Secondary | ICD-10-CM

## 2017-01-03 LAB — BASIC METABOLIC PANEL
Anion gap: 9 (ref 5–15)
BUN: 36 mg/dL — AB (ref 6–20)
CO2: 28 mmol/L (ref 22–32)
CREATININE: 3.29 mg/dL — AB (ref 0.61–1.24)
Calcium: 8.4 mg/dL — ABNORMAL LOW (ref 8.9–10.3)
Chloride: 103 mmol/L (ref 101–111)
GFR calc Af Amer: 21 mL/min — ABNORMAL LOW (ref >60)
GFR, EST NON AFRICAN AMERICAN: 18 mL/min — AB (ref >60)
Glucose, Bld: 99 mg/dL (ref 65–99)
Potassium: 3.9 mmol/L (ref 3.5–5.1)
SODIUM: 140 mmol/L (ref 135–145)

## 2017-01-03 LAB — PROTIME-INR
INR: 1.94
PROTHROMBIN TIME: 22 s — AB (ref 11.4–15.2)

## 2017-01-03 LAB — GLUCOSE, CAPILLARY: GLUCOSE-CAPILLARY: 144 mg/dL — AB (ref 65–99)

## 2017-01-03 MED ORDER — WARFARIN SODIUM 7.5 MG PO TABS
7.5000 mg | ORAL_TABLET | Freq: Once | ORAL | Status: AC
  Administered 2017-01-03: 7.5 mg via ORAL
  Filled 2017-01-03: qty 1

## 2017-01-03 MED ORDER — WARFARIN SODIUM 5 MG PO TABS: 5.0000 mg | ORAL_TABLET | Freq: Once | ORAL | Status: DC

## 2017-01-03 NOTE — Progress Notes (Signed)
Patient's wife came approximately 2100 to P/U her husband. Patient was unable to stand after several trials.patient is a high fall risk and had fallen @ home. Wife stated that she was uncomfortable taking him home without assistance. This was reported to on call MD who cancelled discharge until Sat, 01/03/17.

## 2017-01-03 NOTE — Progress Notes (Signed)
Physical Therapy Treatment Patient Details Name: Mark Adkins MRN: [id number] DOB: August 04, 1945 Today's Date: 01/03/2017    History of Present Illness Pt is a 71 y.o. male presenting to ED on 12/30/16 with worsening SOB, orthopnea, and swelling; worked up for CHF exacerbation. Pertinent PMH includes a-fib/flutter on warfarin, chronic CHF, CKD IV, HTN, OSA on CPAP.    PT Comments    Pt continues to be very limited by c/o bilat foot pain and BLE fatigue/weakness. Requires minA and significant BUE support to stand with RW; only able to amb very short distance with RW and min guard. D/c recommendations updated for SNF-level therapies as pt is not safe to return home and could benefit from continued rehab to maximize functional mobility and independence; pt in agreement with this. Will continue to follow acutely.   Follow Up Recommendations  SNF;Supervision for mobility/OOB     Equipment Recommendations  None recommended by PT    Recommendations for Other Services       Precautions / Restrictions Precautions Precautions: Fall Restrictions Weight Bearing Restrictions: No    Mobility  Bed Mobility Overal bed mobility: Needs Assistance Bed Mobility: Supine to Sit     Supine to sit: Min guard;HOB elevated     General bed mobility comments: Increased time and effort, reliant on HOB elevated and use of bed rails  Transfers Overall transfer level: Needs assistance Equipment used: Rolling walker (2 wheeled) Transfers: Sit to/from Stand Sit to Stand: Min assist;From elevated surface         General transfer comment: MinA to assist trunk elevation with raised bed height. Increased time and effort, reliant on momentum and signficant BUE support to pull into standing  Ambulation/Gait Ambulation/Gait assistance: Min guard Ambulation Distance (Feet): 5 Feet Assistive device: Rolling walker (2 wheeled) Gait Pattern/deviations: Step-to pattern;Wide base of support;Antalgic Gait  velocity: Decreased Gait velocity interpretation: <1.8 ft/sec, indicative of risk for recurrent falls General Gait Details: Able to take slow, antalgic steps from bed to chair with RW. Declining further mobility to c/o pain and BLE weakness   Stairs            Wheelchair Mobility    Modified Rankin (Stroke Patients Only)       Balance Overall balance assessment: Needs assistance   Sitting balance-Leahy Scale: Fair       Standing balance-Leahy Scale: Poor Standing balance comment: Reliant on BUE support                            Cognition Arousal/Alertness: Awake/alert Behavior During Therapy: WFL for tasks assessed/performed Overall Cognitive Status: Within Functional Limits for tasks assessed                                        Exercises      General Comments        Pertinent Vitals/Pain Pain Assessment: Faces Faces Pain Scale: Hurts little more Pain Location: Bilat feet (R>L) when WB Pain Descriptors / Indicators: Aching;Discomfort;Guarding;Grimacing Pain Intervention(s): Monitored during session;Limited activity within patient's tolerance    Home Living                      Prior Function            PT Goals (current goals can now be found in the care plan section) Acute Rehab PT Goals Patient  Stated Goal: Rehab before returning home PT Goal Formulation: With patient Time For Goal Achievement: 01/15/17 Potential to Achieve Goals: Good Progress towards PT goals: Not progressing toward goals - comment(No improvement in BLE symptoms)    Frequency    Min 2X/week      PT Plan Discharge plan needs to be updated;Frequency needs to be updated    Co-evaluation              AM-PAC PT "6 Clicks" Daily Activity  Outcome Measure  Difficulty turning over in bed (including adjusting bedclothes, sheets and blankets)?: Unable Difficulty moving from lying on back to sitting on the side of the bed? :  Unable Difficulty sitting down on and standing up from a chair with arms (e.g., wheelchair, bedside commode, etc,.)?: Unable Help needed moving to and from a bed to chair (including a wheelchair)?: A Little Help needed walking in hospital room?: A Little Help needed climbing 3-5 steps with a railing? : A Lot 6 Click Score: 11    End of Session Equipment Utilized During Treatment: Gait belt Activity Tolerance: Patient limited by pain Patient left: in chair;with call bell/phone within reach Nurse Communication: Mobility status PT Visit Diagnosis: Other abnormalities of gait and mobility (R26.89);Pain Pain - part of body: Ankle and joints of foot     Time: 0752-0807 PT Time Calculation (min) (ACUTE ONLY): 15 min  Charges:  $Therapeutic Activity: 8-22 mins                    G Codes:      Mabeline Caras, PT, DPT Acute Rehab Services  Pager: [place] 01/03/2017, 8:13 AM

## 2017-01-03 NOTE — Progress Notes (Signed)
Informed social work, Geographical information systems officer, of SNF referral  Discharge orders in

## 2017-01-03 NOTE — Evaluation (Signed)
Occupational Therapy Evaluation Patient Details Name: Mark Adkins MRN: [id number] DOB: 11-28-1945 Today's Date: 01/03/2017    History of Present Illness Pt is a 71 y.o. male presenting to ED on 12/30/16 with worsening SOB, orthopnea, and swelling; worked up for CHF exacerbation. Pertinent PMH includes a-fib/flutter on warfarin, chronic CHF, CKD IV, HTN, OSA on CPAP.    Clinical Impression   Pt admitted with above. He demonstrates the below listed deficits and will benefit from continued OT to maximize safety and independence with BADLs.  Pt presents to OT with generalized weakness, decreased activity tolerance, impaired balance, and pain.  He requires max - total A for LB ADLs and min A for functional transfers only.  He is limited by pain.   He lives with wife who is unable to provide physical assistance.  He was mod I - min A PTA.  Recommend SNF level rehab.  Will follow acutely.       Follow Up Recommendations  SNF    Equipment Recommendations  None recommended by OT    Recommendations for Other Services       Precautions / Restrictions Precautions Precautions: Fall      Mobility Bed Mobility               General bed mobility comments: Pt up in chair   Transfers Overall transfer level: Needs assistance Equipment used: Rolling walker (2 wheeled) Transfers: Sit to/from [facility] Sit to Stand: Min assist Stand pivot transfers: Min assist       General transfer comment: Requires momentum and assist to move into standing     Balance Overall balance assessment: Needs assistance Sitting-balance support: Feet supported Sitting balance-Leahy Scale: Fair     Standing balance support: Bilateral upper extremity supported Standing balance-Leahy Scale: Poor Standing balance comment: Reliant on BUE support                           ADL either performed or assessed with clinical judgement   ADL Overall ADL's : Needs  assistance/impaired Eating/Feeding: Independent   Grooming: Wash/dry hands;Wash/dry face;Oral care;Brushing hair;Set up;Sitting   Upper Body Bathing: Set up;Sitting   Lower Body Bathing: Maximal assistance;Sit to/from stand   Upper Body Dressing : Minimal assistance;Sitting   Lower Body Dressing: Total assistance;Sit to/from stand   Toilet Transfer: Minimal assistance;Stand-pivot;Ambulation;BSC;RW   Toileting- Clothing Manipulation and Hygiene: Moderate assistance;Sit to/from stand       Functional mobility during ADLs: Minimal assistance;Rolling walker General ADL Comments: Pt unable to access feet for LB ADLs.  He also requires bil. UE support to maintain standing due to pain, and is unable to [employer] UE to manipulate clothing      Vision         Perception     Praxis      Pertinent Vitals/Pain Pain Assessment: Faces Faces Pain Scale: Hurts even more Pain Location: Bilat feet (R>L) when WB Pain Descriptors / Indicators: Aching;Discomfort;Guarding;Grimacing Pain Intervention(s): Monitored during session;Limited activity within patient's tolerance;Repositioned     Hand Dominance     Extremity/Trunk Assessment Upper Extremity Assessment Upper Extremity Assessment: Generalized weakness   Lower Extremity Assessment Lower Extremity Assessment: Defer to PT evaluation   Cervical / Trunk Assessment Cervical / Trunk Assessment: Other exceptions   Communication Communication Communication: No difficulties   Cognition Arousal/Alertness: Awake/alert Behavior During Therapy: WFL for tasks assessed/performed Overall Cognitive Status: Within Functional Limits for tasks assessed  General Comments       Exercises     Shoulder Instructions      Home Living Family/patient expects to be discharged to:: Skilled nursing facility Living Arrangements: Spouse/significant other Available Help at Discharge:  Family;Available 24 hours/day Type of Home: House Home Access: Stairs to enter [employer] of Steps: 2 thresholds Entrance Stairs-Rails: None Home Layout: One level               Home Equipment: Walker - 2 wheels;Cane - quad;Grab bars - tub/shower;Grab bars - toilet;Adaptive equipment Adaptive Equipment: Reacher;Sock aid;Long-handled sponge Additional Comments: wife has mulitiple issues and cannot provide physical assist       Prior Functioning/Environment Level of Independence: Needs assistance  Gait / Transfers Assistance Needed: mod I ADL's / Homemaking Assistance Needed: requires intermittent assist for LB ADLs.  Uses AE             OT Problem List: Decreased strength;Decreased activity tolerance;Impaired balance (sitting and/or standing);Decreased safety awareness;Decreased knowledge of use of DME or AE;Cardiopulmonary status limiting activity;Obesity;Pain      OT Treatment/Interventions: Self-care/ADL training;Therapeutic exercise;DME and/or AE instruction;Therapeutic activities;Patient/family education;Balance training;Energy conservation    OT Goals(Current goals can be found in the care plan section) Acute Rehab OT Goals Patient Stated Goal: Rehab before returning home OT Goal Formulation: With patient Time For Goal Achievement: 01/17/17 Potential to Achieve Goals: Good ADL Goals Pt Will Perform Grooming: with min guard assist;standing Pt Will Perform Lower Body Bathing: with min assist;with adaptive equipment;sit to/from stand Pt Will Perform Upper Body Dressing: with set-up Pt Will Perform Lower Body Dressing: with min assist;sit to/from stand;with adaptive equipment Pt Will Transfer to Toilet: with min guard assist;bedside commode;ambulating;grab bars Pt Will Perform Toileting - Clothing Manipulation and hygiene: with min guard assist;sit to/from stand  OT Frequency: Min 2X/week   Barriers to D/C: Decreased caregiver support           Co-evaluation              AM-PAC PT "6 Clicks" Daily Activity     Outcome Measure Help from another person eating meals?: None Help from another person taking care of personal grooming?: A Little Help from another person toileting, which includes using toliet, bedpan, or urinal?: A Lot Help from another person bathing (including washing, rinsing, drying)?: A Lot Help from another person to put on and taking off regular upper body clothing?: A Little Help from another person to put on and taking off regular lower body clothing?: Total 6 Click Score: 15   End of Session Equipment Utilized During Treatment: Rolling walker Nurse Communication: Mobility status  Activity Tolerance: Patient limited by pain Patient left: in chair;with call bell/phone within reach;with family/visitor present  OT Visit Diagnosis: Pain Pain - Right/Left: Left Pain - part of body: Ankle and joints of foot                Time: 1610-9604 OT Time Calculation (min): 32 min Charges:  OT General Charges $OT Visit: 1 Visit OT Evaluation $OT Eval Moderate Complexity: 1 Mod OT Treatments $Therapeutic Activity: 8-22 mins G-Codes:     [facility], OTR/L [phone]   Lucille Passy M 01/03/2017, 1:04 PM

## 2017-01-03 NOTE — Progress Notes (Signed)
ANTICOAGULATION CONSULT NOTE  Pharmacy Consult:  Coumadin Indication:  Aflutter  No Known Allergies  Patient Measurements: Height: 5\' 11"  (180.3 cm) Weight: (!) 317 lb 3.9 oz (143.9 kg) IBW/kg (Calculated) : 75.3  Vital Signs: Temp: 98.6 F (37 C) (12/22 0555) Temp Source: Oral (12/22 0555) BP: 160/84 (12/22 0555) Pulse Rate: 78 (12/22 0555)  Labs: Recent Labs    01/01/17 0839 01/02/17 0628 01/03/17 0559  LABPROT 34.4* 25.6* 22.0*  INR 3.44 2.36 1.94  CREATININE 3.21* 3.27* 3.29*    Estimated Creatinine Clearance: 30.8 mL/min (A) (by C-G formula based on SCr of 3.29 mg/dL (H)).   Medical History: Past Medical History:  Diagnosis Date  . Arthritis    "knees, right elbow" (12/31/2016)  . Atrial fibrillation ([place])   . CHF (congestive heart failure) ([place]) 12/31/2016  . CKD (chronic kidney disease), stage IV ([place])   . Depression   . Gout   . History of kidney stones   . Hypercholesterolemia   . Hypertension   . OSA on CPAP   . Type 2 diabetes, diet controlled ([place])      Assessment: 46 YOM with history of Aflutter presented with elevated INR and Coumadin was placed on hold since 12/30/16.  INR trended down to therapeutic 12/21, and Pharmacy received consult to resume Coumadin. INR down to 1.94 (due to held doses). Hg 10.7, plt wnl on 12/18. No bleeding reported.  Home Coumadin dose: 7.5mg  PO daily except 10mg  on TTS per med rec - last dose 12/18 pta  Goal of Therapy:  INR 2-3 Monitor platelets by anticoagulation protocol: Yes  Plan:  Coumadin 7.5mg  PO x 1 tonight Daily INR Monitor CBC, s/sx bleeding Watch DDI with colchicine started for gout flare   Elicia Lamp, PharmD, BCPS Clinical Pharmacist Clinical phone for 01/03/2017 until 3:30pm: [phone] If after 3:30pm, please call main pharmacy at: [phone] 01/03/2017 10:56 AM

## 2017-01-04 LAB — GLUCOSE, CAPILLARY
GLUCOSE-CAPILLARY: 119 mg/dL — AB (ref 65–99)
Glucose-Capillary: 118 mg/dL — ABNORMAL HIGH (ref 65–99)

## 2017-01-04 LAB — BASIC METABOLIC PANEL
ANION GAP: 7 (ref 5–15)
BUN: 44 mg/dL — ABNORMAL HIGH (ref 6–20)
CALCIUM: 8.3 mg/dL — AB (ref 8.9–10.3)
CO2: 30 mmol/L (ref 22–32)
Chloride: 102 mmol/L (ref 101–111)
Creatinine, Ser: 3.45 mg/dL — ABNORMAL HIGH (ref 0.61–1.24)
GFR, EST AFRICAN AMERICAN: 19 mL/min — AB (ref >60)
GFR, EST NON AFRICAN AMERICAN: 17 mL/min — AB (ref >60)
GLUCOSE: 120 mg/dL — AB (ref 65–99)
POTASSIUM: 4 mmol/L (ref 3.5–5.1)
SODIUM: 139 mmol/L (ref 135–145)

## 2017-01-04 LAB — PROTIME-INR
INR: 1.61
PROTHROMBIN TIME: 19 s — AB (ref 11.4–15.2)

## 2017-01-04 MED ORDER — WARFARIN SODIUM 10 MG PO TABS
10.0000 mg | ORAL_TABLET | Freq: Once | ORAL | Status: AC
  Administered 2017-01-04: 10 mg via ORAL
  Filled 2017-01-04: qty 1

## 2017-01-04 MED ORDER — FUROSEMIDE 40 MG PO TABS: 40.0000 mg | ORAL_TABLET | Freq: Two times a day (BID) | ORAL | Status: DC

## 2017-01-04 MED ORDER — WARFARIN SODIUM 5 MG PO TABS: 5.0000 mg | ORAL_TABLET | ORAL | Status: DC

## 2017-01-04 NOTE — Plan of Care (Signed)
  Education: Ability to demonstrate management of disease process will improve 01/04/2017 0426 - Completed/Met by Evert Kohl, RN

## 2017-01-04 NOTE — Progress Notes (Signed)
Progress note  Patient was supposed to be discharged on 01/02/17. However he was weak and it was determined that he needed SNF level of care rather than discharge home. Hence his discharge was canceled at that time.  I personally interviewed and examined patient at bedside today. I reviewed DC summary done by Dr. Wendee Beavers on 12/21 and made necessary updates including subjective and objective exam. I discussed with the pharmacist regarding Coumadin dosing.  As per discussion with clinical social worker, patient has an SNF bed available today for discharge. Patient is updated and aware.  Vernell Leep, MD, FACP, [facility]. [facility] Pager [phone]  If 7PM-7AM, please contact night-coverage [email or web address] Password [facility] 01/04/2017, 12:50 PM

## 2017-01-04 NOTE — Care Management (Signed)
Pt with d/c order yesterday.  Per last CM note, pt was to go home with [facility].  [facility] services were arranged on 01/02/17.    Per charge RN, pt and wife decided yesterday that wanted SNF.  CSW, Caryl Pina, working on Biochemist, clinical for SNF.

## 2017-01-04 NOTE — Progress Notes (Signed)
Pt IV discontinued, catheter intact and telemetry removed. Pt has all belongings. Report called to SNF. Awaiting transportation from [facility]

## 2017-01-04 NOTE — Progress Notes (Signed)
ANTICOAGULATION CONSULT NOTE  Pharmacy Consult:  Coumadin Indication:  Aflutter  No Known Allergies  Patient Measurements: Height: 5\' 11"  (180.3 cm) Weight: (!) 314 lb 3.2 oz (142.5 kg) IBW/kg (Calculated) : 75.3  Vital Signs: Temp: 98.2 F (36.8 C) (12/23 0442) Temp Source: Oral (12/23 0442) BP: 119/92 (12/23 0442) Pulse Rate: 82 (12/23 0442)  Labs: Recent Labs    01/02/17 0628 01/03/17 0559 01/04/17 0421  LABPROT 25.6* 22.0* 19.0*  INR 2.36 1.94 1.61  CREATININE 3.27* 3.29* 3.45*    Estimated Creatinine Clearance: 29.2 mL/min (A) (by C-G formula based on SCr of 3.45 mg/dL (H)).   Medical History: Past Medical History:  Diagnosis Date  . Arthritis    "knees, right elbow" (12/31/2016)  . Atrial fibrillation ([place])   . CHF (congestive heart failure) ([place]) 12/31/2016  . CKD (chronic kidney disease), stage IV ([place])   . Depression   . Gout   . History of kidney stones   . Hypercholesterolemia   . Hypertension   . OSA on CPAP   . Type 2 diabetes, diet controlled ([place])      Assessment: 30 YOM with history of Aflutter presented with elevated INR and Coumadin was placed on hold since 12/30/16.  INR trended down to therapeutic 12/21, and Pharmacy received consult to resume Coumadin 12/21. INR down to 1.64 (due to held doses). Hg 10.7, plt wnl on 12/18. No bleeding reported.  Home Coumadin dose: 7.5mg  PO daily except 10mg  on TTS per med rec - last dose 12/18 pta  Goal of Therapy:  INR 2-3 Monitor platelets by anticoagulation protocol: Yes  Plan:  Coumadin 10mg  PO x 1 tonight Daily INR Monitor CBC, s/sx bleeding Watch DDI with colchicine started for gout flare   Elicia Lamp, PharmD, BCPS Clinical Pharmacist Clinical phone for 01/04/2017 until 3:30pm: [phone] If after 3:30pm, please call main pharmacy at: [phone] 01/04/2017 11:46 AM

## 2017-01-04 NOTE — Discharge Instructions (Signed)

## 2017-01-04 NOTE — Progress Notes (Signed)
Clinical Social Worker facilitated patient discharge including contacting patient family and facility to confirm patient discharge plans.  Clinical information faxed to facility and family agreeable with plan.  CSW arranged ambulance transport via [facility] to [place] at [facility] .  RN [address] to call [phone] (pt will be placed in room 138)  report prior to discharge.  Clinical Social Worker will sign off for now as social work intervention is no longer needed. Please consult [place] again if new need arises.  Mark Adkins, MSW, [place]

## 2017-01-04 NOTE — Progress Notes (Signed)
Pharmacy called and stated to give pt warfarin prior to discharge

## 2017-01-04 NOTE — Discharge Summary (Signed)
Physician Discharge Summary  Mark Adkins PIR:[id number] DOB: Nov 04, 1945 DOA: 12/30/2016  PCP: [facility]  Admit date: 12/30/2016 Discharge date: 01/04/2017  Time spent: > 35 minutes  Recommendations for Outpatient Follow-up:  1. M.D. at SNF in 3 days with repeat labs (CBC, BMP, PT & INR). Adjust Coumadin dose as deemed necessary. 2. PCP at the [place], upon discharge from SNF. 3. Ensure patient follows up with Cardiologist. Encourage compliance with diet 4. Pt with lung nodules on imaging scans please see CT scan results below for details. Will need further work up and monitoring.   Discharge Diagnoses:  Principal Problem:   CHF, acute on chronic [facility]) Active Problems:   Hypertension   Atrial fibrillation (HCC)   Sleep apnea   CKD (chronic kidney disease), stage IV (HCC)   Normocytic anemia   Mediastinal abnormality   Lung nodule   Hypoxia   Discharge Condition: Stable  Diet recommendation: Heart healthy  Filed Weights   01/02/17 0523 01/03/17 0555 01/04/17 0442  Weight: (!) 144.1 kg (317 lb 11.2 oz) (!) 143.9 kg (317 lb 3.9 oz) (!) 142.5 kg (314 lb 3.2 oz)    History of present illness:  71 y.o.malewith medical history significant foratrial fibrillation/flutter on warfarin, chronic diastolic CHF, chronic kidney disease stage IV, hypertension, and OSA on CPAP, now presenting to the emergency department for evaluation of progressive shortness of breath, orthopnea, and swelling. He had symptoms suggestive of orthopnea and chronic lower extremity edema which had progressively worsened over the past couple months prior to admission and he had gained weight.  Hospital Course:  Principal Problem:   CHF, acute on chronic (HCC) diastolic HF - He presented with progressive worsening lower extremity edema, dyspnea and orthopnea. - Noted to have marked peripheral edema, chest x-ray suggestive of pulmonary edema and elevated BMP. - Diuresed aggressively with IV Lasix with  clinical improvement. - He is -9.5 L since admission. Weight is down by 18 pounds since admission. Today's weight 314 pounds. - Transitioned back to prior home dose of Lasix. Will need close monitoring at SNF and adjust doses as needed. - Reason for decompensation unclear.? Issues with compliance with diet and medications. Versus uncontrolled hypertension. - 2-D echo 01/02/17: LVEF 60-65 percent. - Patient counseled extensively regarding importance of compliance with all aspects of medical care and he verbalized understanding.  Active Problems:   Essential Hypertension - Patient was continued on prior home dose of amlodipine and labetalol despite which his blood pressures remain elevated. Thereby rounding MD added BiDil to his regimen. Blood pressures are better. Continue to monitor closely and adjust medications as deemed necessary.     Atrial fibrillation (HCC)/atrial flutter  - CHADS-VASc is at least 3 (age, CHF, HTN)  - INR is 4.24 on admission - Rate controlled on labetalol. Coumadin was temporarily held due to supratherapeutic INR and resumed.  - I discussed with pharmacist today who recommended reducing Coumadin dose as indicated below and monitor INR closely at SNF for further adjustments. No bleeding reported.   Generalized weakness - PT evaluated in house and recommending home health PT. Patient was for discharge on 12/21. However when spouse came to picking up on the night of discharge, he was unable to stand after several trials. He was deemed high fall risk and reported falling at home. Spouse was uncomfortable taking him home without assistance. M.D. was contacted, discharge home was canceled and plan for SNF for rehabilitation. - As per discussion with clinical social worker today, patient has a  bed at SNF and will be discharged there.     Sleep apnea - continue nightly CPAP.     CKD (chronic kidney disease), stage IV (HCC) - creatinine apparently was 2.3 years ago. This  is likely progressed. He presented with creatinine of 69.28 and patient had been in this range during course of hospitalization until today when it has slightly gone up to 3.45. Likely related to aggressive inpatient diuresis. No change in current regimen. Follow BMP in a couple days at SNF. - Recommend outpatient nephrology consultation and follow-up.     Normocytic anemia - Hemoglobin apparently was 13.1 about 2 years ago. Presented with hemoglobin of 10.7, normocytic. May be due to chronic kidney disease versus anemia of chronic disease. Outpatient follow-up and evaluation as deemed necessary.     Mediastinal abnormality/incidental CT findings  - lymph node enlargement see CT scan results - outpatient follow-up and monitoring as deemed necessary.    Lung nodule - will need outpatient monitoring.  Lower extremity edema - Likely multifactorial related to CHF, chronic kidney disease with volume overload, amlodipine use and mild pulmonary artery hypertension. - Continue current diuretics. Recommended outpatient nephrology consultation.   Procedures:  None  Consultations:  None  Subjective: Patient reports feeling much better compared to admission. Denies dyspnea and indicates that his breathing is back to baseline. Reports chronic bilateral leg edema. No chest pain, dizziness, lightheadedness.  Discharge Exam: Vitals:   01/04/17 0200 01/04/17 0442  BP: (!) 157/86 (!) 119/92  Pulse: 75 82  Resp:  20  Temp:  98.2 F (36.8 C)  SpO2: 94% 98%    Gen. exam: Pleasant middle-aged male, moderately built and obese, lying comfortably propped up in bed without distress.  Respiratory exam: Occasional basal crackles but otherwise clear to auscultation. No increased work of breathing. Cardiovascular system: S1 and S2 heard, irregularly irregular. No JVD or murmurs. 2+ pitting bilateral leg edema. Telemetry personally reviewed: Atrial flutter with variable block but controlled ventricular  rate. Abdominal exam: Nondistended, soft and nontender. Normal bowel sounds heard. CNS: Alert and oriented. No focal neurological deficits. Extremities: Symmetric 5 x 5 power.  Discharge Instructions   Discharge Instructions    (HEART FAILURE PATIENTS) Call MD:  Anytime you have any of the following symptoms: 1) 3 pound weight gain in 24 hours or 5 pounds in 1 week 2) shortness of breath, with or without a dry hacking cough 3) swelling in the hands, feet or stomach 4) if you have to sleep on extra pillows at night in order to breathe.   Complete by:  As directed    Call MD for:  difficulty breathing, headache or visual disturbances   Complete by:  As directed    Call MD for:  extreme fatigue   Complete by:  As directed    Call MD for:  persistant dizziness or light-headedness   Complete by:  As directed    Diet - low sodium heart healthy   Complete by:  As directed    Increase activity slowly   Complete by:  As directed      Allergies as of 01/04/2017   No Known Allergies     Medication List    TAKE these medications   acetaminophen 500 MG tablet Commonly known as:  TYLENOL Take 1,000 mg by mouth 2 (two) times daily.   amLODipine 10 MG tablet Commonly known as:  NORVASC Take 10 mg by mouth daily.   b complex vitamins capsule Take 1 capsule by  mouth daily.   docusate sodium 100 MG capsule Commonly known as:  COLACE Take 100 mg by mouth 2 (two) times daily.   fluticasone 50 MCG/ACT nasal spray Commonly known as:  FLONASE Place 2 sprays into both nostrils daily as needed for allergies or rhinitis.   furosemide 40 MG tablet Commonly known as:  LASIX Take 40 mg by mouth 2 (two) times daily.   isosorbide-hydrALAZINE 20-37.5 MG tablet Commonly known as:  BIDIL Take 1 tablet by mouth 3 (three) times daily.   labetalol 300 MG tablet Commonly known as:  NORMODYNE Take 300 mg by mouth 2 (two) times daily.   nitroGLYCERIN 0.4 MG SL tablet Commonly known as:   NITROSTAT Place 0.4 mg under the tongue every 5 (five) minutes as needed for chest pain.   pravastatin 40 MG tablet Commonly known as:  PRAVACHOL Take 40 mg by mouth at bedtime.   tamsulosin 0.4 MG Caps capsule Commonly known as:  FLOMAX Take 0.4 mg by mouth daily after supper.   vitamin C with rose hips 1000 MG tablet Take 1,000 mg by mouth daily.   Vitamin D3 2000 units capsule Take 2,000 Units by mouth daily.   vitamin E 1000 UNIT capsule Take 1,000 Units by mouth daily.   warfarin 5 MG tablet Commonly known as:  COUMADIN Take 1 tablet (5 mg total) by mouth See admin instructions. Take 7.5 mg daily except 10 MG on Tuesdays and Thursdays. What changed:  additional instructions   zinc sulfate 220 (50 Zn) MG capsule Take 220 mg by mouth daily.      No Known Allergies  Contact information for follow-up providers    [facility] Follow up.   Specialty:  Home Health Services Why:  Home health services (PT) were arranged, they will call you to set up home visits  Contact information: [place] [address] [phone]        [facility]. Schedule an appointment as soon as possible for a visit.   Specialty:  General Practice Why:  Upon discharge from SNF. Contact information: [place] [address] [phone]        M.D. at SNF. Schedule an appointment as soon as possible for a visit in 3 day(s).   Why:  To be seen with repeat labs (CBC, BMP, PT & INR). Adjust Coumadin dose as needed.           Contact information for after-discharge care    Destination    [facility] Follow up.   Service:  Skilled Nursing Contact information: [place] [phone]                   The results of significant diagnostics from this hospitalization (including imaging, microbiology, ancillary and laboratory) are listed below for reference.     Significant Diagnostic Studies: Dg Chest 2 View  Result Date: 12/30/2016 CLINICAL DATA:  71 year old male with increasing shortness of breath with exertion. Chronic cough. EXAM: CHEST  2 VIEW COMPARISON:  None. FINDINGS: Cardiac size is at the upper limits of normal to mildly enlarged. There is mild tortuosity of the thoracic aorta. There is prominent central pulmonary vasculature, and an asymmetrically rounded appearance of the right hilum on the frontal view. Questionable hilar enlargement on the lateral view (3-3.5 cm diameter). Randolm Idol* ED Delice Lesch a mother Visualized tracheal air column is within normal limits. No pneumothorax, pleural effusion or confluent  pulmonary opacity. Increased pulmonary vascularity, but no minimal if any pleural fluid in the fissures. Negative visible bowel gas pattern. No acute osseous abnormality identified. IMPRESSION: 1. No prior study for comparison. 2. Questionable abnormal right hilar enlargement. CT Chest with IV contrast would evaluate further. 3. Increased pulmonary vascularity, such that mild or developing interstitial edema is difficult to exclude. No pleural effusion identified. 4. Mild cardiomegaly. Electronically Signed   By: Genevie Ann M.D.   On: 12/30/2016 17:11   Ct Chest Wo Contrast  Result Date: 12/31/2016 CLINICAL DATA:  Shortness of breath. EXAM: CT CHEST WITHOUT CONTRAST TECHNIQUE: Multidetector CT imaging of the chest was performed following the standard protocol without IV contrast. COMPARISON:  Chest radiographs yesterday. FINDINGS: Cardiovascular: Mild multi chamber cardiomegaly. There are coronary artery calcifications. Enlargement of the main pulmonary artery measuring 4.6 cm. Mild atherosclerosis of the thoracic aorta. Mediastinum/Nodes: Probable upper paratracheal node measures 16 mm. This is contiguous with lobular low-density that may reflect a low-density lymph node versus fluid in the superior pericardial recess. Additional smaller mediastinal  nodes are not enlarged by size criteria. There is no bulky hilar adenopathy, lack of IV contrast limits assessment of the hila. The esophagus is nondistended. Visualized thyroid gland is normal. No axillary adenopathy. Lungs/Pleura: Small Bochdalek hernia on the left containing fat. Tiny subpleural 4 mm nodule in the right lower lobe image 125 series 4. Minimal scattered subpleural and perifissural atelectasis in both lungs. No consolidation. No evidence pulmonary edema. No pleural effusion. Trachea and mainstem bronchi are patent. Upper Abdomen: Incidental colonic diverticulosis. No acute abnormality. Musculoskeletal: Bulky anterior osteophytes in the midthoracic spine. There are no acute or suspicious osseous abnormalities. IMPRESSION: 1. Multi chamber cardiomegaly. There are coronary artery calcifications. Mild aortic atherosclerosis. 2. Enlargement of the main pulmonary artery (4.6 cm) consistent with pulmonary arterial hypertension. Right hilar prominence on prior radiograph is likely secondary to enlarged pulmonary artery. No bulky adenopathy, however or hilar evaluation is limited in the absence of IV contrast. 3. Prominent paratracheal lymph nodes versus fluid in the superior pericardial recess. Recommend clinical consultation and 3-6 month follow-up CT. 4. Tiny subpleural 4 mm right lower lobe nodule. This is likely an intrapulmonary lymph node, however no prior exams available for comparison. No follow-up needed if patient is low-risk. Non-contrast chest CT can be considered in 12 months if patient is high-risk. This recommendation follows the consensus statement: Guidelines for Management of Incidental Pulmonary Nodules Detected on CT Images: From the Fleischner Society 2017; Radiology 2017; 284:228-243. Aortic Atherosclerosis (ICD10-I70.0). Electronically Signed   By: Jeb Levering M.D.   On: 12/31/2016 00:58     Labs: Basic Metabolic Panel: Recent Labs  Lab 12/30/16 1629 01/01/17 0839  01/01/17 1812 01/02/17 0628 01/03/17 0559 01/04/17 0421  NA 139 140  --  141 140 139  K 4.2 3.5  --  3.7 3.9 4.0  CL 104 101  --  103 103 102  CO2 27 31  --  30 28 30   GLUCOSE 147* 190*  --  120* 99 120*  BUN 35* 33*  --  34* 36* 44*  CREATININE 3.28* 3.21*  --  3.27* 3.29* 3.45*  CALCIUM 8.5* 8.5*  --  8.6* 8.4* 8.3*  MG  --   --  1.9  --   --   --   PHOS  --   --  3.7  --   --   --    CBC: Recent Labs  Lab 12/30/16 1629  WBC 4.0  HGB 10.7*  HCT 35.2*  MCV 99.7  PLT 218   Cardiac Enzymes: Recent Labs  Lab 12/31/16 0052  TROPONINI <0.03   BNP: BNP (last 3 results) Recent Labs    12/30/16 1629  BNP 259.1*     CBG: Recent Labs  Lab 01/03/17 2123 01/04/17 0731 01/04/17 1153  GLUCAP Hollymead, MD, [place], [facility]. [facility] Pager [phone]  If 7PM-7AM, please contact night-coverage [email or web address] Password [facility] 01/04/2017, 12:46 PM

## 2017-01-04 NOTE — NC FL2 (Signed)
[place] LEVEL OF CARE SCREENING TOOL     IDENTIFICATION  Patient Name: Mark Adkins Birthdate: 07-15-1945 Sex: male Admission Date (Current Location): 12/30/2016  [facility] and [place] Number:  Herbalist and Address:  [facility], [place] [address], [place], [place] [address]      Provider Number: [id number]  Attending Physician Name and Address:  Modena Jansky, MD  Relative Name and Phone Number:  River Ambrosio, [phone]    Current Level of Care: Hospital Recommended Level of Care: [place] Prior Approval Number:    Date Approved/Denied:   PASRR Number: [id number]  Discharge Plan: SNF    Current Diagnoses: Patient Active Problem List   Diagnosis Date Noted  . Hypoxia   . Hypertension 12/31/2016  . Atrial fibrillation ([place]) 12/31/2016  . Sleep apnea 12/31/2016  . CKD (chronic kidney disease), stage IV ([place]) 12/31/2016  . Normocytic anemia 12/31/2016  . CHF, acute on chronic ([place]) 12/31/2016  . Mediastinal abnormality 12/31/2016  . Lung nodule 12/31/2016    Orientation RESPIRATION BLADDER Height & Weight     Time, Place, Self, Situation  Normal, Other (Comment)(pt on cpap at night) Continent Weight: (!) 314 lb 3.2 oz (142.5 kg) Height:  5\' 11"  (180.3 cm)  BEHAVIORAL SYMPTOMS/MOOD NEUROLOGICAL BOWEL NUTRITION STATUS      Continent Diet(Heart Room)  AMBULATORY STATUS COMMUNICATION OF NEEDS Skin   Limited Assist Verbally Normal                       Personal Care Assistance Level of Assistance  Bathing, Feeding, Dressing Bathing Assistance: Limited assistance Feeding assistance: Independent Dressing Assistance: Limited assistance     Functional Limitations Info  Sight, Hearing, Speech Sight Info: Adequate Hearing Info: Adequate Speech Info: Adequate    SPECIAL CARE FACTORS FREQUENCY  PT (By licensed PT), OT (By licensed OT)     PT Frequency: 5x wk OT Frequency: 5x wk            Contractures Contractures Info: Not present    Additional Factors Info  Code Status, Allergies Code Status Info: full code Allergies Info: No known allergies           Current Medications (01/04/2017):  This is the current hospital active medication list Current Facility-Administered Medications  Medication Dose Route Frequency Provider Last Rate Last Dose  . 0.9 %  sodium chloride infusion  250 mL Intravenous PRN Opyd, Ilene Qua, MD      . acetaminophen (TYLENOL) tablet 650 mg  650 mg Oral Q4H PRN Vianne Bulls, MD   650 mg at 01/02/17 2115  . alum & mag hydroxide-simeth (MAALOX/MYLANTA) 200-200-20 MG/5ML suspension 30 mL  30 mL Oral Q4H PRN Kirby-Graham, Karsten Fells, NP      . amLODipine (NORVASC) tablet 10 mg  10 mg Oral Daily Opyd, Ilene Qua, MD   10 mg at 01/04/17 0826  . B-complex with vitamin C tablet 1 tablet  1 tablet Oral Daily Velvet Bathe, MD   1 tablet at 01/04/17 [phone]  . cholecalciferol (VITAMIN D) tablet 2,000 Units  2,000 Units Oral Daily Opyd, Ilene Qua, MD   2,000 Units at 01/04/17 [phone]  . colchicine tablet 0.6 mg  0.6 mg Oral Daily Bajbus, Lauren D, RPH   0.6 mg at 01/04/17 0825  . docusate sodium (COLACE) capsule 100 mg  100 mg Oral BID Vianne Bulls, MD   100 mg at 01/04/17 0826  .  fluticasone (FLONASE) 50 MCG/ACT nasal spray 2 spray  2 spray Each Nare Daily PRN Opyd, Ilene Qua, MD      . Derrill Memo ON 01/05/2017] furosemide (LASIX) tablet 40 mg  40 mg Oral BID Hongalgi, Anand D, MD      . guaiFENesin-dextromethorphan (ROBITUSSIN DM) 100-10 MG/5ML syrup 5 mL  5 mL Oral Q4H PRN Gardiner Barefoot, NP   5 mL at 01/04/17 0036  . hydrALAZINE (APRESOLINE) injection 10 mg  10 mg Intravenous Q4H PRN Opyd, Ilene Qua, MD   10 mg at 12/31/16 1817  . hydrocortisone (ANUSOL-HC) 2.5 % rectal cream 1 application  1 application Topical QID PRN Kirby-Graham, Karsten Fells, NP      . isosorbide-hydrALAZINE (BIDIL) 20-37.5 MG per tablet 1 tablet  1 tablet Oral TID Velvet Bathe, MD    1 tablet at 01/04/17 [phone]  . labetalol (NORMODYNE) tablet 300 mg  300 mg Oral BID Opyd, Ilene Qua, MD   300 mg at 01/04/17 0825  . lip balm (BLISTEX) ointment   Topical PRN Lyndee Leo, [facility]      . MUSCLE RUB CREA 1 application  1 application Topical PRN Kirby-Graham, Karsten Fells, NP   1 application at 96/04/54 1032  . ondansetron (ZOFRAN) injection 4 mg  4 mg Intravenous Q6H PRN Opyd, Ilene Qua, MD      . phenol (CHLORASEPTIC) mouth spray 1 spray  1 spray Mouth/Throat PRN Kirby-Graham, Karsten Fells, NP      . polyvinyl alcohol (LIQUIFILM TEARS) 1.4 % ophthalmic solution 2 drop  2 drop Both Eyes PRN Kirby-Graham, Karsten Fells, NP      . pravastatin (PRAVACHOL) tablet 40 mg  40 mg Oral QHS Opyd, Ilene Qua, MD   40 mg at 01/03/17 2222  . sodium chloride (OCEAN) 0.65 % nasal spray 1 spray  1 spray Each Nare PRN Kirby-Graham, Karsten Fells, NP      . sodium chloride flush (NS) 0.9 % injection 3 mL  3 mL Intravenous Q12H Opyd, Ilene Qua, MD   3 mL at 01/04/17 0829  . sodium chloride flush (NS) 0.9 % injection 3 mL  3 mL Intravenous PRN Opyd, Ilene Qua, MD      . tamsulosin (FLOMAX) capsule 0.4 mg  0.4 mg Oral QPC supper Opyd, Ilene Qua, MD   0.4 mg at 01/03/17 1644  . warfarin (COUMADIN) tablet 10 mg  10 mg Oral ONCE-1800 Elicia Lamp [place], [place]      . Warfarin - Pharmacist Dosing Inpatient   Does not apply q1800 Tyrone Apple, [place]      . zinc sulfate capsule 220 mg  220 mg Oral Daily Opyd, Ilene Qua, MD   220 mg at 01/04/17 0981     Discharge Medications: Please see discharge summary for a list of discharge medications.  Relevant Imaging Results:  Relevant Lab Results:   Additional Information [phone] pt wears CPAP at night  Wende Neighbors, LCSW

## 2017-01-04 NOTE — Clinical Social Work Note (Signed)
Clinical Social Work Assessment  Patient Details  Name: Mark Adkins MRN: [id number] Date of Birth: [birth year]  Date of referral:  01/04/17               Reason for consult:  Discharge Planning, Facility Placement                Permission sought to share information with:  Family Supports Permission granted to share information::  Yes, Verbal Permission Granted  Name::     Jaystin Mcgarvey  Agency::  snf  Relationship::  spouse  Contact Information:  [phone]  Housing/Transportation Living arrangements for the past 2 months:  [place] of Information:  Patient Patient Interpreter Needed:  None Criminal Activity/Legal Involvement Pertinent to Current Situation/Hospitalization:  No - Comment as needed Significant Relationships:  Spouse Lives with:  Spouse Do you feel safe going back to the place where you live?  Yes Need for family participation in patient care:  Yes (Comment)  Care giving concerns:  Patients wife at bedside  Social Worker assessment / plan: CSW met patient at bedside with spouse present during assessment. Family agrees that patient should discharge to SNF. Patients spouse Hulan Amato stated she is unable to take care of patient at home and would prefer he goes to SNF. Patient stated he has been at a facility in the past and stated he would like to go back to that facility. CSW explained to patient that due to the weekend it limits his choices. Patient stated he would prefer being placed in [place] because that is where he resides. CSW to follow with patient on bed offers    Employment status:  Retired Forensic scientist:  Medicare PT Recommendations:  [place] / Referral to community resources:  [place]  Patient/Family's Response to care: Family appreciates CSW role in patient care  Patient/Family's Understanding of and Emotional Response to Diagnosis, Current Treatment, and Prognosis:  Patient  agreeable to discharge to short term rehab to be able to get back to baseline.   Emotional Assessment Appearance:  Appears stated age Attitude/Demeanor/Rapport:  Other Affect (typically observed):  Pleasant Orientation:  Oriented to Place, Oriented to Self, Oriented to  Time, Oriented to Situation Alcohol / Substance use:  Not Applicable Psych involvement (Current and /or in the community):  No (Comment)  Discharge Needs  Concerns to be addressed:  No discharge needs identified Readmission within the last 30 days:  No Current discharge risk:  None Barriers to Discharge:  No Barriers Identified   Wende Neighbors, LCSW 01/04/2017, 11:38 AM

## 2017-01-04 NOTE — Progress Notes (Signed)
PTAR at bedside, report given  Transport has all pt belongings, AVS, prescriptions and update given to wife

## 2017-02-27 DIAGNOSIS — I509 Heart failure, unspecified: Secondary | ICD-10-CM

## 2017-02-27 HISTORY — DX: Heart failure, unspecified: I50.9

## 2017-05-04 ENCOUNTER — Encounter: Payer: Self-pay | Admitting: Internal Medicine

## 2017-05-04 ENCOUNTER — Non-Acute Institutional Stay (SKILLED_NURSING_FACILITY): Payer: Medicare Other | Admitting: Internal Medicine

## 2017-05-04 DIAGNOSIS — N170 Acute kidney failure with tubular necrosis: Secondary | ICD-10-CM

## 2017-05-04 DIAGNOSIS — I5032 Chronic diastolic (congestive) heart failure: Secondary | ICD-10-CM | POA: Diagnosis not present

## 2017-05-04 DIAGNOSIS — M10361 Gout due to renal impairment, right knee: Secondary | ICD-10-CM

## 2017-05-04 DIAGNOSIS — M1A0610 Idiopathic chronic gout, right knee, without tophus (tophi): Secondary | ICD-10-CM

## 2017-05-04 DIAGNOSIS — I10 Essential (primary) hypertension: Secondary | ICD-10-CM

## 2017-05-04 DIAGNOSIS — I4891 Unspecified atrial fibrillation: Secondary | ICD-10-CM | POA: Diagnosis not present

## 2017-05-04 DIAGNOSIS — N400 Enlarged prostate without lower urinary tract symptoms: Secondary | ICD-10-CM | POA: Diagnosis not present

## 2017-05-04 DIAGNOSIS — N184 Chronic kidney disease, stage 4 (severe): Secondary | ICD-10-CM

## 2017-05-04 NOTE — Progress Notes (Signed)
: Provider:  Noah Delaine. [name], MD Location:  [place] Room Number: 267T Place of Service:  SNF (31)  PCP: [place] Patient Care Team: [place] as PCP - General (General Practice)  Extended Emergency Contact Information Primary Emergency Contact: Juliene Pina Address: [address]          [place], [place] [address] [place] Phone: [phone] Relation: Spouse Secondary Emergency Contact: Everett Graff Mobile Phone: [phone] Relation: Friend     Allergies: Patient has no known allergies.  Chief Complaint  Patient presents with  . New Admit To SNF    Admit to Facility    HPI: Patient is 72 y.o. male with prior peripheral vascular disease, obesity, hypertension, diabetes mellitus 2, chronic kidney disease stage IV, BPH, atrial fibrillation on anticoagulant, arthritis, gout, and HFpEF who presented to [facility] ED with severe bilateral knee and elbow pain.  Patient admitted to not taking his allopurinol.  On arrival to the emergency department patient's temperature was 101.2 O2 sat 94% on 2 L with creatinine of 3.59 (baseline 3.4-3.5), BUN 52 no white count hemoglobin 12.8.  Patient was admitted to [facility] from 4/13-19 where he was treated for an acute gout flare.  Patient underwent arthrocentesis which was negative for crystals, patient was started on Rocephin blood and urine were obtained and cultures were negative.  Gout flare was treated with prednisone with improvement.  During hospitalization patient had acute on chronic kidney disease stage IV with creatinine trending up to a peak of 4.1, thought to be multifactorial.  Lasix was held .p.o. fluid was liberalized with improvement and patient was started back on Lasix 40 twice daily which is why he got dry the first place.  Patient's chronic medical problems were addressed and were stable.  Patient is  admitted to skilled nursing facility for OT/PT.  While at skilled nursing facility patient will be followed for BPH treated with Flomax, atrial fibrillation treated with warfarin, no beta-blocker secondary to history of ablation and intermittent AV block, and hypertension treated with Norvasc, hydralazine, Isordil,.  Past Medical History:  Diagnosis Date  . Acute on chronic congestive heart failure ([place]) 02/27/2017  . Arthritis    "knees, right elbow" (12/31/2016)  . Atrial fibrillation ([place])   . BPH (benign prostatic hyperplasia)   . CHF (congestive heart failure) ([place]) 12/31/2016  . CKD (chronic kidney disease), stage IV ([place])   . COPD (chronic obstructive pulmonary disease) ([place])   . Depression   . Diabetes mellitus ([place])   . Diabetes mellitus with renal complications ([place])   . Gout   . History of kidney stones   . Hypercholesterolemia   . Hypertension   . Obesity   . OSA on CPAP   . Peripheral vascular disease ([place])   . Type 2 diabetes, diet controlled ([place])     Past Surgical History:  Procedure Laterality Date  . HERNIA REPAIR    . INGUINAL HERNIA REPAIR Bilateral   . TEE WITH CARDIOVERSION  ~ 2007  . UMBILICAL HERNIA REPAIR      Allergies as of 05/04/2017   No Known Allergies     Medication List        Accurate as of 05/04/17 11:27 AM. Always use your most recent med list.          acetaminophen 500 MG tablet Commonly known as:  TYLENOL Take 500 mg by mouth  every 4 (four) hours as needed.   amLODipine 10 MG tablet Commonly known as:  NORVASC Take 10 mg by mouth daily.   docusate sodium 100 MG capsule Commonly known as:  COLACE Take 100 mg by mouth 2 (two) times daily.   eucerin cream Apply 1 application topically as needed for dry skin. On legs   fluticasone 50 MCG/ACT nasal spray Commonly known as:  FLONASE Place 1 spray into both nostrils daily as needed for allergies or rhinitis.   furosemide 40 MG tablet Commonly known as:  LASIX Take 40 mg by  mouth 2 (two) times daily.   hydrALAZINE 100 MG tablet Commonly known as:  APRESOLINE Take 100 mg by mouth 3 (three) times daily. Take at the same time as isosrbide   insulin lispro 100 UNIT/ML injection Commonly known as:  HUMALOG Inject 3-15 Units into the skin 3 (three) times daily before meals. Inject per facility sliding scale   isosorbide dinitrate 20 MG tablet Commonly known as:  ISORDIL Take 40 mg by mouth 3 (three) times daily. Take at the same time as hydralazine   nitroGLYCERIN 0.4 MG SL tablet Commonly known as:  NITROSTAT Place 0.4 mg under the tongue every 5 (five) minutes as needed for chest pain.   pantoprazole 40 MG tablet Commonly known as:  PROTONIX Take 40 mg by mouth daily.   pravastatin 40 MG tablet Commonly known as:  PRAVACHOL Take 40 mg by mouth at bedtime.   predniSONE 10 MG tablet Commonly known as:  DELTASONE Take by mouth. Take 3 tablets for 2 days (4/20 - 4/21), followed by 2 tablets daily (20 mg) for 3 days (4/22 - 4/24), followed by 1 tablet daily (10mg ) for 3 days (4/25 - 4/27), then stop   sodium chloride 0.65 % nasal spray Commonly known as:  OCEAN Place 1 spray into the nose as needed for congestion.   tamsulosin 0.4 MG Caps capsule Commonly known as:  FLOMAX Take 0.4 mg by mouth daily after supper.   VICKS VAPORUB CHILDRENS 4.8-1.2-2.6 % Oint Apply 1 application topically daily as needed.   Vitamin D3 2000 units capsule Take 2,000 Units by mouth daily.   warfarin 5 MG tablet Commonly known as:  COUMADIN Take 7.5 mg by mouth daily. 1 and 1/2 tablet       No orders of the defined types were placed in this encounter.    There is no immunization history on file for this patient.  Social History   Tobacco Use  . Smoking status: Former Research scientist (life sciences)  . Smokeless tobacco: Never Used  Substance Use Topics  . Alcohol use: Yes    Alcohol/week: 5.4 oz    Types: 3 Glasses of wine, 3 Cans of beer, 3 Shots of liquor per week     Frequency: Never    Comment: 70.5 oz / week , occ    Family history is   Family History  Problem Relation Age of Onset  . Hypertension Mother   . Hypertension Father   . Sudden Cardiac Death Neg Hx       Review of Systems  DATA OBTAINED: from patient, nurse GENERAL:  no fevers, fatigue, appetite changes SKIN: No itching, or rash EYES: No eye pain, redness, discharge EARS: No earache, tinnitus, change in hearing NOSE: No congestion, drainage or bleeding  MOUTH/THROAT: No mouth or tooth pain, No sore throat RESPIRATORY: No cough, wheezing, SOB CARDIAC: No chest pain, palpitations, lower extremity edema  GI: No abdominal pain, No N/V/D  or constipation, No heartburn or reflux  GU: No dysuria, frequency or urgency, or incontinence  MUSCULOSKELETAL: No unrelieved bone/joint pain NEUROLOGIC: No headache, dizziness or focal weakness PSYCHIATRIC: No c/o anxiety or sadness   Vitals:   05/04/17 1040  BP: (!) 142/87  Pulse: 77  Resp: 16  Temp: 98 F (36.7 C)  SpO2: 98%    SpO2 Readings from Last 1 Encounters:  05/04/17 98%   Body mass index is 44.35 kg/m.     Physical Exam  GENERAL APPEARANCE: Alert, conversant,  No acute distress.  SKIN: No diaphoresis rash HEAD: Normocephalic, atraumatic  EYES: Conjunctiva/lids clear. Pupils round, reactive. EOMs intact.  EARS: External exam WNL, canals clear. Hearing grossly normal.  NOSE: No deformity or discharge.  MOUTH/THROAT: Lips w/o lesions  RESPIRATORY: Breathing is even, unlabored. Lung sounds are clear   CARDIOVASCULAR: Heart RRR no murmurs, rubs or gallops. No peripheral edema.   GASTROINTESTINAL: Abdomen is soft, non-tender, not distended w/ normal bowel sounds. GENITOURINARY: Bladder non tender, not distended  MUSCULOSKELETAL: No abnormal joints or musculature NEUROLOGIC:  Cranial nerves 2-12 grossly intact. Moves all extremities  PSYCHIATRIC: Mood and affect appropriate to situation, no behavioral issues  Patient  Active Problem List   Diagnosis Date Noted  . Hypoxia   . Hypertension 12/31/2016  . Atrial fibrillation ([place]) 12/31/2016  . Sleep apnea 12/31/2016  . CKD (chronic kidney disease), stage IV ([place]) 12/31/2016  . Normocytic anemia 12/31/2016  . CHF, acute on chronic ([place]) 12/31/2016  . Mediastinal abnormality 12/31/2016  . Lung nodule 12/31/2016      Labs reviewed: Basic Metabolic Panel:    Component Value Date/Time   NA 139 01/04/2017 0421   K 4.0 01/04/2017 0421   CL 102 01/04/2017 0421   CO2 30 01/04/2017 0421   GLUCOSE 120 (H) 01/04/2017 0421   BUN 44 (H) 01/04/2017 0421   CREATININE 3.45 (H) 01/04/2017 0421   CALCIUM 8.3 (L) 01/04/2017 0421   GFRNONAA 17 (L) 01/04/2017 0421   GFRAA 19 (L) 01/04/2017 0421    Recent Labs    01/01/17 1812 01/02/17 0628 01/03/17 0559 01/04/17 0421  NA  --  141 140 139  K  --  3.7 3.9 4.0  CL  --  103 103 102  CO2  --  30 28 30   GLUCOSE  --  120* 99 120*  BUN  --  34* 36* 44*  CREATININE  --  3.27* 3.29* 3.45*  CALCIUM  --  8.6* 8.4* 8.3*  MG 1.9  --   --   --   PHOS 3.7  --   --   --    Liver Function Tests: No results for input(s): AST, ALT, ALKPHOS, BILITOT, PROT, ALBUMIN in the last 8760 hours. No results for input(s): LIPASE, AMYLASE in the last 8760 hours. No results for input(s): AMMONIA in the last 8760 hours. CBC: Recent Labs    12/30/16 1629  WBC 4.0  HGB 10.7*  HCT 35.2*  MCV 99.7  PLT 218   Lipid No results for input(s): CHOL, HDL, LDLCALC, TRIG in the last 8760 hours.  Cardiac Enzymes: Recent Labs    12/31/16 0052  TROPONINI <0.03   BNP: Recent Labs    12/30/16 1629  BNP 259.1*   No results found for: MICROALBUR No results found for: HGBA1C No results found for: TSH No results found for: VITAMINB12 No results found for: FOLATE No results found for: IRON, TIBC, FERRITIN  Imaging and Procedures obtained prior to SNF  admission: Dg Chest 2 View  Result Date: 12/30/2016 CLINICAL DATA:   72 year old male with increasing shortness of breath with exertion. Chronic cough. EXAM: CHEST  2 VIEW COMPARISON:  None. FINDINGS: Cardiac size is at the upper limits of normal to mildly enlarged. There is mild tortuosity of the thoracic aorta. There is prominent central pulmonary vasculature, and an asymmetrically rounded appearance of the right hilum on the frontal view. Questionable hilar enlargement on the lateral view (3-3.5 cm diameter). Randolm Idol* ED Delice Lesch a mother Visualized tracheal air column is within normal limits. No pneumothorax, pleural effusion or confluent pulmonary opacity. Increased pulmonary vascularity, but no minimal if any pleural fluid in the fissures. Negative visible bowel gas pattern. No acute osseous abnormality identified. IMPRESSION: 1. No prior study for comparison. 2. Questionable abnormal right hilar enlargement. CT Chest with IV contrast would evaluate further. 3. Increased pulmonary vascularity, such that mild or developing interstitial edema is difficult to exclude. No pleural effusion identified. 4. Mild cardiomegaly. Electronically Signed   By: Genevie Ann M.D.   On: 12/30/2016 17:11   Ct Chest Wo Contrast  Result Date: 12/31/2016 CLINICAL DATA:  Shortness of breath. EXAM: CT CHEST WITHOUT CONTRAST TECHNIQUE: Multidetector CT imaging of the chest was performed following the standard protocol without IV contrast. COMPARISON:  Chest radiographs yesterday. FINDINGS: Cardiovascular: Mild multi chamber cardiomegaly. There are coronary artery calcifications. Enlargement of the main pulmonary artery measuring 4.6 cm. Mild atherosclerosis of the thoracic aorta. Mediastinum/Nodes: Probable upper paratracheal node measures 16 mm. This is contiguous with lobular low-density that may reflect a low-density lymph node versus fluid in the superior pericardial recess. Additional smaller mediastinal nodes are not enlarged by size criteria. There is no bulky hilar adenopathy, lack of IV contrast  limits assessment of the hila. The esophagus is nondistended. Visualized thyroid gland is normal. No axillary adenopathy. Lungs/Pleura: Small Bochdalek hernia on the left containing fat. Tiny subpleural 4 mm nodule in the right lower lobe image 125 series 4. Minimal scattered subpleural and perifissural atelectasis in both lungs. No consolidation. No evidence pulmonary edema. No pleural effusion. Trachea and mainstem bronchi are patent. Upper Abdomen: Incidental colonic diverticulosis. No acute abnormality. Musculoskeletal: Bulky anterior osteophytes in the midthoracic spine. There are no acute or suspicious osseous abnormalities. IMPRESSION: 1. Multi chamber cardiomegaly. There are coronary artery calcifications. Mild aortic atherosclerosis. 2. Enlargement of the main pulmonary artery (4.6 cm) consistent with pulmonary arterial hypertension. Right hilar prominence on prior radiograph is likely secondary to enlarged pulmonary artery. No bulky adenopathy, however or hilar evaluation is limited in the absence of IV contrast. 3. Prominent paratracheal lymph nodes versus fluid in the superior pericardial recess. Recommend clinical consultation and 3-6 month follow-up CT. 4. Tiny subpleural 4 mm right lower lobe nodule. This is likely an intrapulmonary lymph node, however no prior exams available for comparison. No follow-up needed if patient is low-risk. Non-contrast chest CT can be considered in 12 months if patient is high-risk. This recommendation follows the consensus statement: Guidelines for Management of Incidental Pulmonary Nodules Detected on CT Images: From the Fleischner Society 2017; Radiology 2017; 284:228-243. Aortic Atherosclerosis (ICD10-I70.0). Electronically Signed   By: Jeb Levering M.D.   On: 12/31/2016 00:58     Not all labs, radiology exams or other studies done during hospitalization come through on my EPIC note; however they are reviewed by me.    Assessment and Plan  Acute gout  flare/chronic gout- known history of gout controlled on allopurinol but patient reported med noncompliance; temperature  101.2 O2 saturation 94% on 2 L creatinine of 3.59 is close to baseline no elevated white count uric acid 10 urinalysis negative respiratory viral panel negative arthrocentesis negative for crystals patient was treated.  Patient was treated with Rocephin blood cultures and urine cultures no growth; patient was treated with prednisone and discharged with taper SNF -admitted for OT/PT; prednisone taper 30 mg daily for 2 days 20 mg for 3 days 10 mg for 3 days then off; sliding scale insulin while on prednisone, felt like patient will not need insulin afterwards  Acute on chronic kidney disease stage IV/HFpEF- patient's creatinine trended up to a peak of 4.1, etiology thought to be multifactorial including intrarenal secondary to urate crystals, and prerenal azotemia secondary to poor p.o. intake Lasix was held and p.o. fluid was liberalized creatinine trended back to baseline prior to discharge SNF -Lasix 40 mg twice daily is resumed; follow-up BMP; discharge BUN 81 discharge creatinine 3.31  Atrial fibrillation-beta-blocker was held per cardiology after recent ablation given intermittent AV block SNF -continue warfarin 7.5 mg daily which will be actively titrated  BPH- known to be treated with Flomax which patient has been noncompliant with this is been raised; this is been restarted SNF -continue Flomax 0.4 mg daily  Hypertension SNF -controlled; continue Norvasc 10 mg daily hydralazine 100 mg 3 times daily Isordil 40 mg 3 times daily   Time spent greater than 45 minutes;> 50% of time with patient was spent reviewing records, labs, tests and studies, counseling and developing plan of care  Webb Silversmith D. [name], MD

## 2017-05-06 LAB — BASIC METABOLIC PANEL
BUN: 61 — AB (ref 4–21)
CREATININE: 3 — AB (ref 0.6–1.3)
Glucose: 125
POTASSIUM: 4.3 (ref 3.4–5.3)
Sodium: 145 (ref 137–147)

## 2017-05-06 LAB — CBC AND DIFFERENTIAL
HCT: 34 — AB (ref 41–53)
HEMOGLOBIN: 10.7 — AB (ref 13.5–17.5)
PLATELETS: 373 (ref 150–399)
WBC: 6.8

## 2017-05-10 ENCOUNTER — Encounter: Payer: Self-pay | Admitting: Internal Medicine

## 2017-05-10 DIAGNOSIS — I5030 Unspecified diastolic (congestive) heart failure: Secondary | ICD-10-CM | POA: Insufficient documentation

## 2017-05-10 DIAGNOSIS — M1A9XX0 Chronic gout, unspecified, without tophus (tophi): Secondary | ICD-10-CM | POA: Insufficient documentation

## 2017-05-10 DIAGNOSIS — M109 Gout, unspecified: Secondary | ICD-10-CM | POA: Insufficient documentation

## 2017-05-10 DIAGNOSIS — N179 Acute kidney failure, unspecified: Secondary | ICD-10-CM | POA: Insufficient documentation

## 2017-05-10 DIAGNOSIS — N400 Enlarged prostate without lower urinary tract symptoms: Secondary | ICD-10-CM | POA: Insufficient documentation

## 2017-05-10 DIAGNOSIS — N184 Chronic kidney disease, stage 4 (severe): Secondary | ICD-10-CM

## 2017-05-18 ENCOUNTER — Non-Acute Institutional Stay (SKILLED_NURSING_FACILITY): Payer: Medicare Other | Admitting: Internal Medicine

## 2017-05-18 ENCOUNTER — Encounter: Payer: Self-pay | Admitting: Internal Medicine

## 2017-05-18 DIAGNOSIS — N184 Chronic kidney disease, stage 4 (severe): Secondary | ICD-10-CM

## 2017-05-18 DIAGNOSIS — I4891 Unspecified atrial fibrillation: Secondary | ICD-10-CM

## 2017-05-18 DIAGNOSIS — N400 Enlarged prostate without lower urinary tract symptoms: Secondary | ICD-10-CM | POA: Diagnosis not present

## 2017-05-18 DIAGNOSIS — M1A0610 Idiopathic chronic gout, right knee, without tophus (tophi): Secondary | ICD-10-CM | POA: Diagnosis not present

## 2017-05-18 DIAGNOSIS — I5032 Chronic diastolic (congestive) heart failure: Secondary | ICD-10-CM

## 2017-05-18 DIAGNOSIS — N170 Acute kidney failure with tubular necrosis: Secondary | ICD-10-CM | POA: Diagnosis not present

## 2017-05-18 DIAGNOSIS — I10 Essential (primary) hypertension: Secondary | ICD-10-CM

## 2017-05-18 DIAGNOSIS — M10361 Gout due to renal impairment, right knee: Secondary | ICD-10-CM

## 2017-05-18 NOTE — Progress Notes (Signed)
Location:  [facility] Room Number: 885O Place of Service:  SNF ([id number])  Mark Adkins. [name], MD  PCP: [facility] Patient Care Team: [facility] as PCP - General (General Practice)  Extended Emergency Contact Information Primary Emergency Contact: Juliene Pina Address: [address]          [place], [place] [address] [place] Phone: [phone] Relation: Spouse Secondary Emergency Contact: Everett Graff Mobile Phone: [phone] Relation: Friend  No Known Allergies  Chief Complaint  Patient presents with  . Discharge Note    Discharged from SNF    HPI:  72 y.o. male with peripheral vascular disease, obesity, hypertension, diabetes mellitus 2, chronic kidney disease stage IV, BPH, atrial fibrillation on anticoagulant, arthritis, gout, and heart failure with preserved ejection fraction who presented to [facility] ED with severe bilateral knee and elbow pain.  Patient admitted to not taking his allopurinol.  Patient was admitted to [facility] from 4/13-19 where he was treated for an acute gout flare.  Patient underwent arthrocentesis which is negative for crystals.  Gout flare was treated with prednisone with improvement.  During hospitalization patient had acute on chronic kidney disease stage IV with creatinine trending up to a peak of 4.1 thought to be multifactorial.  This improved and patient was admitted to skilled nursing facility for OT/PT and is now ready to be discharged home.    Past Medical History:  Diagnosis Date  . Acute on chronic congestive heart failure ([place]) 02/27/2017  . Arthritis    "knees, right elbow" (12/31/2016)  . Atrial fibrillation ([place])   . BPH (benign prostatic hyperplasia)   . CHF (congestive heart failure) ([place]) 12/31/2016  . CKD (chronic kidney disease), stage IV ([place])   . COPD (chronic obstructive pulmonary disease) ([place])   .  Depression   . Diabetes mellitus ([place])   . Diabetes mellitus with renal complications ([place])   . Gout   . History of kidney stones   . Hypercholesterolemia   . Hypertension   . Obesity   . OSA on CPAP   . Peripheral vascular disease ([place])   . Type 2 diabetes, diet controlled ([place])     Past Surgical History:  Procedure Laterality Date  . HERNIA REPAIR    . INGUINAL HERNIA REPAIR Bilateral   . TEE WITH CARDIOVERSION  ~ 2007  . UMBILICAL HERNIA REPAIR       reports that he has quit smoking. He has never used smokeless tobacco. He reports that he drinks about 5.4 oz of alcohol per week. He reports that he does not use drugs. Social History   Socioeconomic History  . Marital status: Married    Spouse name: Not on file  . Number of children: Not on file  . Years of education: Not on file  . Highest education level: Not on file  Occupational History  . Not on file  Social Needs  . Financial resource strain: Not on file  . Food insecurity:    Worry: Not on file    Inability: Not on file  . Transportation needs:    Medical: Not on file    Non-medical: Not on file  Tobacco Use  . Smoking status: Former Research scientist (life sciences)  . Smokeless tobacco: Never Used  Substance and Sexual Activity  . Alcohol use: Yes    Alcohol/week: 5.4 oz    Types: 3 Glasses of wine, 3 Cans of  beer, 3 Shots of liquor per week    Frequency: Never    Comment: 70.5 oz / week , occ  . Drug use: No  . Sexual activity: Not Currently  Lifestyle  . Physical activity:    Days per week: Not on file    Minutes per session: Not on file  . Stress: Not on file  Relationships  . Social connections:    Talks on phone: Not on file    Gets together: Not on file    Attends religious service: Not on file    Active member of club or organization: Not on file    Attends meetings of clubs or organizations: Not on file    Relationship status: Not on file  . Intimate partner violence:    Fear of current or ex partner: Not on  file    Emotionally abused: Not on file    Physically abused: Not on file    Forced sexual activity: Not on file  Other Topics Concern  . Not on file  Social History Narrative  . Not on file    Pertinent  Health Maintenance Due  Topic Date Due  . COLONOSCOPY  09/17/1997  . PNA vac Low Risk Adult (1 of 2 - PCV13) 09/17/2012  . INFLUENZA VACCINE  08/13/2017    Medications: Allergies as of 05/18/2017   No Known Allergies     Medication List        Accurate as of 05/18/17  1:08 PM. Always use your most recent med list.          amLODipine 10 MG tablet Commonly known as:  NORVASC Take 10 mg by mouth daily.   docusate sodium 100 MG capsule Commonly known as:  COLACE Take 100 mg by mouth 2 (two) times daily.   eucerin cream Apply 1 application topically 3 (three) times daily as needed for dry skin. On legs   fluticasone 50 MCG/ACT nasal spray Commonly known as:  FLONASE Place 1 spray into both nostrils daily as needed for allergies or rhinitis.   furosemide 40 MG tablet Commonly known as:  LASIX Take 40 mg by mouth 2 (two) times daily.   hydrALAZINE 100 MG tablet Commonly known as:  APRESOLINE Take 100 mg by mouth 3 (three) times daily. Take at the same time as isosrbide   insulin lispro 100 UNIT/ML injection Commonly known as:  HUMALOG Inject 0-15 Units into the skin 3 (three) times daily before meals. Inject per facility sliding scale : Inject 3-15 units into skin daily after meals. CBG 70-120=0 units,121-150=2 units, 151-200=3 units, 201-250=5 units, 251-300=8 units,301-350=11units,351-400=15 units Greater than 400 Call MD and give 15 units   isosorbide dinitrate 20 MG tablet Commonly known as:  ISORDIL Take 40 mg by mouth 3 (three) times daily. Take at the same time as hydralazine   pravastatin 40 MG tablet Commonly known as:  PRAVACHOL Take 40 mg by mouth at bedtime.   tamsulosin 0.4 MG Caps capsule Commonly known as:  FLOMAX Take 0.4 mg by mouth daily  after supper.   Vitamin D3 2000 units capsule Take 2,000 Units by mouth daily.   warfarin 5 MG tablet Commonly known as:  COUMADIN Take 5 mg by mouth daily.        Vitals:   05/18/17 1131  BP: (!) 145/92  Pulse: 85  Resp: 20  Temp: (!) 97 F (36.1 C)  TempSrc: Oral  SpO2: 98%  Weight: (!) 303 lb 8 oz (137.7 kg)  Height: 5\' 11"  (1.803 m)   Body mass index is 42.33 kg/m.  Physical Exam  GENERAL APPEARANCE: Alert, conversant. No acute distress.  HEENT: Unremarkable. RESPIRATORY: Breathing is even, unlabored. Lung sounds are clear   CARDIOVASCULAR: Heart RRR no murmurs, rubs or gallops. No peripheral edema.  GASTROINTESTINAL: Abdomen is soft, non-tender, not distended w/ normal bowel sounds.  NEUROLOGIC: Cranial nerves 2-12 grossly intact. Moves all extremities   Labs reviewed: Basic Metabolic Panel: Recent Labs    01/01/17 1812 01/02/17 0628 01/03/17 0559 01/04/17 0421 05/06/17  NA  --  141 140 139 145  K  --  3.7 3.9 4.0 4.3  CL  --  103 103 102  --   CO2  --  30 28 30   --   GLUCOSE  --  120* 99 120*  --   BUN  --  34* 36* 44* 61*  CREATININE  --  3.27* 3.29* 3.45* 3.0*  CALCIUM  --  8.6* 8.4* 8.3*  --   MG 1.9  --   --   --   --   PHOS 3.7  --   --   --   --    No results found for: [facility] Liver Function Tests: No results for input(s): AST, ALT, ALKPHOS, BILITOT, PROT, ALBUMIN in the last 8760 hours. No results for input(s): LIPASE, AMYLASE in the last 8760 hours. No results for input(s): AMMONIA in the last 8760 hours. CBC: Recent Labs    12/30/16 1629 05/06/17  WBC 4.0 6.8  HGB 10.7* 10.7*  HCT 35.2* 34*  MCV 99.7  --   PLT 218 373   Lipid No results for input(s): CHOL, HDL, LDLCALC, TRIG in the last 8760 hours. Cardiac Enzymes: Recent Labs    12/31/16 0052  TROPONINI <0.03   BNP: Recent Labs    12/30/16 1629  BNP 259.1*   CBG: Recent Labs    01/03/17 2123 01/04/17 0731 01/04/17 1153  GLUCAP 144* 119* 118*     Procedures and Imaging Studies During Stay: No results found.  Assessment/Plan:   Acute gout due to renal impairment involving right knee  Idiopathic chronic gout of right knee without tophus  Acute renal failure with acute tubular necrosis superimposed on stage 4 chronic kidney disease (HCC)  Chronic heart failure with preserved ejection fraction (HCC)  Atrial fibrillation, unspecified type ([place])  Benign prostatic hyperplasia without lower urinary tract symptoms  Essential hypertension   Patient is being discharged with the following home health services: Nursing; patient wishes to participate in outpatient OT PT  Patient is being discharged with the following durable medical equipment: None  Patient has been advised to f/u with their PCP in 1-2 weeks to bring them up to date on their rehab stay.  Social services at facility was responsible for arranging this appointment.  Pt was provided with a 30 day supply of prescriptions for medications and refills must be obtained from their PCP.  For controlled substances, a more limited supply may be provided adequate until PCP appointment only.  Medications have been reconciled.  Time spent greater than 30 minutes;> 50% of time with patient was spent reviewing records, labs, tests and studies, counseling and developing plan of care  Mark Adkins. [name], MD

## 2017-05-19 ENCOUNTER — Other Ambulatory Visit: Payer: Self-pay | Admitting: Internal Medicine

## 2018-03-29 ENCOUNTER — Encounter: Payer: Self-pay | Admitting: Internal Medicine

## 2018-03-29 ENCOUNTER — Non-Acute Institutional Stay (SKILLED_NURSING_FACILITY): Payer: Medicare Other | Admitting: Internal Medicine

## 2018-03-29 DIAGNOSIS — I10 Essential (primary) hypertension: Secondary | ICD-10-CM

## 2018-03-29 DIAGNOSIS — J9601 Acute respiratory failure with hypoxia: Secondary | ICD-10-CM

## 2018-03-29 DIAGNOSIS — I5033 Acute on chronic diastolic (congestive) heart failure: Secondary | ICD-10-CM | POA: Diagnosis not present

## 2018-03-29 DIAGNOSIS — J441 Chronic obstructive pulmonary disease with (acute) exacerbation: Secondary | ICD-10-CM

## 2018-03-29 DIAGNOSIS — N185 Chronic kidney disease, stage 5: Secondary | ICD-10-CM

## 2018-03-29 DIAGNOSIS — E785 Hyperlipidemia, unspecified: Secondary | ICD-10-CM

## 2018-03-29 DIAGNOSIS — M1A0610 Idiopathic chronic gout, right knee, without tophus (tophi): Secondary | ICD-10-CM

## 2018-03-29 DIAGNOSIS — I4821 Permanent atrial fibrillation: Secondary | ICD-10-CM

## 2018-03-29 DIAGNOSIS — J9602 Acute respiratory failure with hypercapnia: Secondary | ICD-10-CM

## 2018-03-29 NOTE — Progress Notes (Signed)
:  Location:  [facility] Room Number: [phone] Place of Service:  SNF (31)  Mark Adkins. [name], MD  Patient Care Team: [place] as PCP - General (General Practice)  Extended Emergency Contact Information Primary Emergency Contact: Juliene Pina Address: [address]          [place], [place] [address] [place] Phone: [phone] Relation: Spouse Secondary Emergency Contact: Everett Graff Mobile Phone: [phone] Relation: Friend     Allergies: Patient has no known allergies.  Chief Complaint  Patient presents with  . New Admit To SNF    Admit to [facility]    HPI: Patient is 73 y.o. 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 to undergo a super fistulization of left upper extremity brachiocephalic fistula on 01/22/5091.  Patient was admitted to [facility] from 3/4-15.  Postoperatively patient was hypoxic and hypercapnic and was placed on BiPAP to stabilize respiratory status patient is now on continuous O2 but otherwise stable patient was treated with steroids.  During his admission INR had been supratherapeutic and on discharge it is 2.8 hospital course was further complicated by volume overload secondary to chronic diastolic heart failure and chronic kidney disease stage IV which was treated with Lasix.  Patient is awaiting fistula maturation before starting hemodialysis.  Patient is admitted to SNF for OT/PT.  While at skilled nursing facility patient will be followed for atrial fibrillation treated with Coumadin, gout treated with Zyloprim and hypertension treated with Norvasc hydralazine metoprolol Lasix.  Past Medical History:  Diagnosis Date  . Acute on chronic congestive heart failure ([place])  02/27/2017  . Arthritis    "knees, right elbow" (12/31/2016)  . Atrial fibrillation ([place])   . BPH (benign prostatic hyperplasia)   . CHF (congestive heart failure) ([place]) 12/31/2016  . CKD (chronic kidney disease), stage IV ([place])   . COPD (chronic obstructive pulmonary disease) ([place])   . Depression   . Diabetes mellitus ([place])   . Diabetes mellitus with renal complications ([place])   . Gout   . History of kidney stones   . Hypercholesterolemia   . Hypertension   . Obesity   . OSA on CPAP   . Peripheral vascular disease ([place])   . Type 2 diabetes, diet controlled ([place])     Past Surgical History:  Procedure Laterality Date  . HERNIA REPAIR    . INGUINAL HERNIA REPAIR Bilateral   . TEE WITH CARDIOVERSION  ~ 2007  . UMBILICAL HERNIA REPAIR      Allergies as of 03/29/2018   No Known Allergies     Medication List       Accurate as of March 29, 2018  3:07 PM. Always use your most recent med list.        acetaminophen 500 MG tablet Commonly known as:  TYLENOL Take 500 mg by mouth every 4 (four) hours as needed.   albuterol (2.5 MG/3ML) 0.083% nebulizer solution Commonly known as:  PROVENTIL Take 2.5 mg by nebulization every 6 (six) hours as needed for wheezing or shortness of breath.   albuterol 108 (90 Base) MCG/ACT inhaler Commonly known as:  PROVENTIL HFA;VENTOLIN HFA Inhale 2 puffs into the lungs every 4 (four) hours as needed for wheezing or shortness of breath.   allopurinol 100 MG  tablet Commonly known as:  ZYLOPRIM Take 100 mg by mouth daily.   amLODipine 10 MG tablet Commonly known as:  NORVASC Take 10 mg by mouth daily.   benzonatate 100 MG capsule Commonly known as:  TESSALON Take 100 mg by mouth 3 (three) times daily.   bisacodyl 10 MG suppository Commonly known as:  DULCOLAX Place 10 mg rectally as needed for moderate constipation. For up to 10 days.   Breo Ellipta 200-25 MCG/INH Aepb Generic drug:  fluticasone furoate-vilanterol Inhale 1 puff into  the lungs daily.   clotrimazole 1 % cream Commonly known as:  LOTRIMIN Apply 1 application topically 2 (two) times daily.   eucerin cream Apply 1 application topically 3 (three) times daily as needed for dry skin. On legs   furosemide 40 MG tablet Commonly known as:  LASIX Take 80 mg by mouth 2 (two) times daily.   guaifenesin 400 MG Tabs tablet Commonly known as:  HUMIBID E Take 600 mg by mouth every 12 (twelve) hours.   hydrALAZINE 100 MG tablet Commonly known as:  APRESOLINE Take 100 mg by mouth 3 (three) times daily. Take at the same time as isosrbide   Incruse Ellipta 62.5 MCG/INH Aepb Generic drug:  umeclidinium bromide Inhale 1 puff into the lungs daily.   isosorbide dinitrate 20 MG tablet Commonly known as:  ISORDIL Take 40 mg by mouth 3 (three) times daily. Take at the same time as hydralazine   metolazone 5 MG tablet Commonly known as:  ZAROXOLYN Take 5 mg by mouth every other day.   metoprolol tartrate 25 MG tablet Commonly known as:  LOPRESSOR Take 12.5 mg by mouth 2 (two) times daily.   nitroGLYCERIN 0.4 MG SL tablet Commonly known as:  NITROSTAT Place 0.4 mg under the tongue every 5 (five) minutes as needed for chest pain.   nystatin powder Generic drug:  nystatin Apply topically 2 (two) times daily.   pantoprazole 40 MG tablet Commonly known as:  PROTONIX Take 40 mg by mouth daily.   potassium chloride 20 MEQ packet Commonly known as:  KLOR-CON Take by mouth daily.   pravastatin 40 MG tablet Commonly known as:  PRAVACHOL Take 40 mg by mouth at bedtime.   tamsulosin 0.4 MG Caps capsule Commonly known as:  FLOMAX Take 0.4 mg by mouth daily after supper.   Vitamin D3 250 MCG (10000 UT) Tabs Take 1,000 Units by mouth daily.   warfarin 5 MG tablet Commonly known as:  COUMADIN Take 5 mg by mouth daily.       No orders of the defined types were placed in this encounter.    There is no immunization history on file for this patient.   Social History   Tobacco Use  . Smoking status: Former Research scientist (life sciences)  . Smokeless tobacco: Never Used  Substance Use Topics  . Alcohol use: Yes    Alcohol/week: 9.0 standard drinks    Types: 3 Glasses of wine, 3 Cans of beer, 3 Shots of liquor per week    Frequency: Never    Comment: 70.5 oz / week , occ    Family history is   Family History  Problem Relation Age of Onset  . Hypertension Mother   . Hypertension Father   . Sudden Cardiac Death Neg Hx       Review of Systems  DATA OBTAINED: from patient GENERAL:  no fevers, fatigue, appetite changes SKIN: No itching, or rash EYES: No eye pain, redness, discharge EARS: No earache, tinnitus,  change in hearing NOSE: No congestion, drainage or bleeding  MOUTH/THROAT: No mouth or tooth pain, No sore throat RESPIRATORY: No cough, wheezing, SOB CARDIAC: No chest pain, palpitations, lower extremity edema  GI: No abdominal pain, No N/V/D or constipation, No heartburn or reflux  GU: No dysuria, frequency or urgency, or incontinence  MUSCULOSKELETAL: No unrelieved bone/joint pain NEUROLOGIC: No headache, dizziness or focal weakness PSYCHIATRIC: No c/o anxiety or sadness   Vitals:   03/29/18 1431  BP: (!) 99/58  Pulse: 72  Resp: 19  Temp: 97.9 F (36.6 C)    SpO2 Readings from Last 1 Encounters:  05/18/17 98%   Body mass index is 42.26 kg/m.     Physical Exam  GENERAL APPEARANCE: Alert, conversant,  No acute distress.  SKIN: No diaphoresis rash, lower extremity skin very dry HEAD: Normocephalic, atraumatic  EYES: Conjunctiva/lids clear. Pupils round, reactive. EOMs intact.  EARS: External exam WNL, canals clear. Hearing grossly normal.  NOSE: No deformity or discharge.  MOUTH/THROAT: Lips w/o lesions  RESPIRATORY: Breathing is even, unlabored. Lung sounds are clear   CARDIOVASCULAR: Heart RRR 2/6 murmur, no rubs or gallops.  Trace peripheral edema.;  Shunt with drainage tube left upper extremity GASTROINTESTINAL:  Abdomen is soft, non-tender, not distended w/ normal bowel sounds. GENITOURINARY: Bladder non tender, not distended  MUSCULOSKELETAL: No abnormal joints or musculature NEUROLOGIC:  Cranial nerves 2-12 grossly intact. Moves all extremities  PSYCHIATRIC: Mood and affect appropriate to situation, no behavioral issues  Patient Active Problem List   Diagnosis Date Noted  . Gout flare 05/10/2017  . Chronic gout 05/10/2017  . Acute renal failure superimposed on stage 4 chronic kidney disease ([place]) 05/10/2017  . Heart failure with preserved ejection fraction ([place]) 05/10/2017  . BPH (benign prostatic hyperplasia) 05/10/2017  . Hypoxia   . Hypertension 12/31/2016  . Atrial fibrillation ([place]) 12/31/2016  . Sleep apnea 12/31/2016  . CKD (chronic kidney disease), stage IV ([place]) 12/31/2016  . Normocytic anemia 12/31/2016  . CHF, acute on chronic ([place]) 12/31/2016  . Mediastinal abnormality 12/31/2016  . Lung nodule 12/31/2016      Labs reviewed: Basic Metabolic Panel:    Component Value Date/Time   NA 145 05/06/2017   K 4.3 05/06/2017   CL 102 01/04/2017 0421   CO2 30 01/04/2017 0421   GLUCOSE 120 (H) 01/04/2017 0421   BUN 61 (A) 05/06/2017   CREATININE 3.0 (A) 05/06/2017   CREATININE 3.45 (H) 01/04/2017 0421   CALCIUM 8.3 (L) 01/04/2017 0421   GFRNONAA 17 (L) 01/04/2017 0421   GFRAA 19 (L) 01/04/2017 0421    Recent Labs    05/06/17  NA 145  K 4.3  BUN 61*  CREATININE 3.0*   Liver Function Tests: No results for input(s): AST, ALT, ALKPHOS, BILITOT, PROT, ALBUMIN in the last 8760 hours. No results for input(s): LIPASE, AMYLASE in the last 8760 hours. No results for input(s): AMMONIA in the last 8760 hours. CBC: Recent Labs    05/06/17  WBC 6.8  HGB 10.7*  HCT 34*  PLT 373   Lipid No results for input(s): CHOL, HDL, LDLCALC, TRIG in the last 8760 hours.  Cardiac Enzymes: No results for input(s): CKTOTAL, CKMB, CKMBINDEX, TROPONINI in the last 8760 hours. BNP: No  results for input(s): BNP in the last 8760 hours. No results found for: MICROALBUR No results found for: HGBA1C No results found for: TSH No results found for: VITAMINB12 No results found for: FOLATE No results found for: IRON, TIBC, FERRITIN  Imaging  and Procedures obtained prior to SNF admission: Dg Chest 2 View  Result Date: 12/30/2016 CLINICAL DATA:  73 year old male with increasing shortness of breath with exertion. Chronic cough. EXAM: CHEST  2 VIEW COMPARISON:  None. FINDINGS: Cardiac size is at the upper limits of normal to mildly enlarged. There is mild tortuosity of the thoracic aorta. There is prominent central pulmonary vasculature, and an asymmetrically rounded appearance of the right hilum on the frontal view. Questionable hilar enlargement on the lateral view (3-3.5 cm diameter). Randolm Idol* ED Delice Lesch a mother Visualized tracheal air column is within normal limits. No pneumothorax, pleural effusion or confluent pulmonary opacity. Increased pulmonary vascularity, but no minimal if any pleural fluid in the fissures. Negative visible bowel gas pattern. No acute osseous abnormality identified. IMPRESSION: 1. No prior study for comparison. 2. Questionable abnormal right hilar enlargement. CT Chest with IV contrast would evaluate further. 3. Increased pulmonary vascularity, such that mild or developing interstitial edema is difficult to exclude. No pleural effusion identified. 4. Mild cardiomegaly. Electronically Signed   By: Genevie Ann M.D.   On: 12/30/2016 17:11   Ct Chest Wo Contrast  Result Date: 12/31/2016 CLINICAL DATA:  Shortness of breath. EXAM: CT CHEST WITHOUT CONTRAST TECHNIQUE: Multidetector CT imaging of the chest was performed following the standard protocol without IV contrast. COMPARISON:  Chest radiographs yesterday. FINDINGS: Cardiovascular: Mild multi chamber cardiomegaly. There are coronary artery calcifications. Enlargement of the main pulmonary artery measuring 4.6 cm. Mild  atherosclerosis of the thoracic aorta. Mediastinum/Nodes: Probable upper paratracheal node measures 16 mm. This is contiguous with lobular low-density that may reflect a low-density lymph node versus fluid in the superior pericardial recess. Additional smaller mediastinal nodes are not enlarged by size criteria. There is no bulky hilar adenopathy, lack of IV contrast limits assessment of the hila. The esophagus is nondistended. Visualized thyroid gland is normal. No axillary adenopathy. Lungs/Pleura: Small Bochdalek hernia on the left containing fat. Tiny subpleural 4 mm nodule in the right lower lobe image 125 series 4. Minimal scattered subpleural and perifissural atelectasis in both lungs. No consolidation. No evidence pulmonary edema. No pleural effusion. Trachea and mainstem bronchi are patent. Upper Abdomen: Incidental colonic diverticulosis. No acute abnormality. Musculoskeletal: Bulky anterior osteophytes in the midthoracic spine. There are no acute or suspicious osseous abnormalities. IMPRESSION: 1. Multi chamber cardiomegaly. There are coronary artery calcifications. Mild aortic atherosclerosis. 2. Enlargement of the main pulmonary artery (4.6 cm) consistent with pulmonary arterial hypertension. Right hilar prominence on prior radiograph is likely secondary to enlarged pulmonary artery. No bulky adenopathy, however or hilar evaluation is limited in the absence of IV contrast. 3. Prominent paratracheal lymph nodes versus fluid in the superior pericardial recess. Recommend clinical consultation and 3-6 month follow-up CT. 4. Tiny subpleural 4 mm right lower lobe nodule. This is likely an intrapulmonary lymph node, however no prior exams available for comparison. No follow-up needed if patient is low-risk. Non-contrast chest CT can be considered in 12 months if patient is high-risk. This recommendation follows the consensus statement: Guidelines for Management of Incidental Pulmonary Nodules Detected on CT  Images: From the Fleischner Society 2017; Radiology 2017; 284:228-243. Aortic Atherosclerosis (ICD10-I70.0). Electronically Signed   By: Jeb Levering M.D.   On: 12/31/2016 00:58     Not all labs, radiology exams or other studies done during hospitalization come through on my EPIC note; however they are reviewed by me.    Assessment and Plan  CKD stage IV/left brachiocephalic fistula- patient was admitted for same to prepare  for dialysis; waiting for fistula to mature before can be used SNF-admitted for OT/PT  Acute respiratory failure with hypoxia and hypercapnia/COPD exacerbation -postop patient was placed on BiPAP to stabilize eventually able to be placed on 2 L of O2; normally was using it only at night now he is using it all the time SNF- continue Brio Ellipta 200-25 1 puff daily, Incruse Ellipta 62.5 mcg 1 puff daily albuterol MDI as needed Tessalon Perles as needed guaifenesin 600 mg every 12  Chronic atrial fibrillation- patient was supra therapeutic during hospitalization but on day of discharge INR was 2.8; hemoglobin hematocrit were stable although there are no labs to look at currently SNF- we start Coumadin 5 mg daily with 7.5 mg on Tuesdays and Thursdays; continue metoprolol 12.5 mg twice daily for rate  Acute on chronic diastolic congestive heart failure- treated with Lasix and transitioned to oral Lasix plus Zaroxolyn; expect volume status to normalize once on dialysis SNF- continue Lasix 80 mg twice daily metoprolol 12.5 mg twice daily Zaroxolyn 5 mg every other day M potassium 20 mg daily  Gout SNF -Appears controlled continue Zyloprim 100 mg daily  Hyperlipidemia SNF-not stated as uncontrolled; continue Pravachol 40 mg daily  Hypertension SNF- continue metoprolol 12.5 mg twice daily, Isordil 40 mg 3 times daily, hydralazine 100 mg 3 times daily, Lasix 80 mg twice daily, Norvasc 10 mg daily    Time spent greater than 45 minutes;> 50% of time with patient was  spent reviewing records, labs, tests and studies, counseling and developing plan of care  Webb Silversmith D. [name], MD

## 2018-03-31 ENCOUNTER — Encounter: Payer: Self-pay | Admitting: Internal Medicine

## 2018-03-31 DIAGNOSIS — N185 Chronic kidney disease, stage 5: Secondary | ICD-10-CM | POA: Insufficient documentation

## 2018-03-31 DIAGNOSIS — J9601 Acute respiratory failure with hypoxia: Secondary | ICD-10-CM | POA: Insufficient documentation

## 2018-03-31 DIAGNOSIS — J441 Chronic obstructive pulmonary disease with (acute) exacerbation: Secondary | ICD-10-CM | POA: Insufficient documentation

## 2018-03-31 DIAGNOSIS — J9602 Acute respiratory failure with hypercapnia: Secondary | ICD-10-CM

## 2018-03-31 DIAGNOSIS — E785 Hyperlipidemia, unspecified: Secondary | ICD-10-CM | POA: Insufficient documentation

## 2018-04-01 LAB — BASIC METABOLIC PANEL
BUN: 107 — AB (ref 4–21)
Creatinine: 5.4 — AB (ref 0.6–1.3)
Glucose: 119
Potassium: 3.9 (ref 3.4–5.3)
Sodium: 140 (ref 137–147)

## 2018-04-01 LAB — CBC AND DIFFERENTIAL
HCT: 26 — AB (ref 41–53)
Hemoglobin: 8.7 — AB (ref 13.5–17.5)
PLATELETS: 380 (ref 150–399)
WBC: 4.5

## 2018-04-14 ENCOUNTER — Encounter: Payer: Self-pay | Admitting: Adult Health

## 2018-04-14 ENCOUNTER — Non-Acute Institutional Stay (SKILLED_NURSING_FACILITY): Payer: Medicare Other | Admitting: Adult Health

## 2018-04-14 DIAGNOSIS — N185 Chronic kidney disease, stage 5: Secondary | ICD-10-CM

## 2018-04-14 DIAGNOSIS — K219 Gastro-esophageal reflux disease without esophagitis: Secondary | ICD-10-CM

## 2018-04-14 DIAGNOSIS — I132 Hypertensive heart and chronic kidney disease with heart failure and with stage 5 chronic kidney disease, or end stage renal disease: Secondary | ICD-10-CM

## 2018-04-14 DIAGNOSIS — I5032 Chronic diastolic (congestive) heart failure: Secondary | ICD-10-CM | POA: Diagnosis not present

## 2018-04-14 DIAGNOSIS — I4821 Permanent atrial fibrillation: Secondary | ICD-10-CM | POA: Diagnosis not present

## 2018-04-14 DIAGNOSIS — E785 Hyperlipidemia, unspecified: Secondary | ICD-10-CM

## 2018-04-14 DIAGNOSIS — J441 Chronic obstructive pulmonary disease with (acute) exacerbation: Secondary | ICD-10-CM

## 2018-04-14 DIAGNOSIS — M1A3610 Chronic gout due to renal impairment, right knee, without tophus (tophi): Secondary | ICD-10-CM

## 2018-04-14 DIAGNOSIS — N400 Enlarged prostate without lower urinary tract symptoms: Secondary | ICD-10-CM

## 2018-04-14 NOTE — Progress Notes (Signed)
Location:   [facility] Room Number: 101 Place of Service:  SNF (31)   CODE STATUS: FULL  No Known Allergies  Chief Complaint  Patient presents with  . 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 Chronic diastolic heart failure. Weekly follow up for the first 30 days post hospitalization.     HPI:  He is a 73 year old short term rehab patient being seen for the management of his chronic illnesses; afib; hypertensive heart disease; heart failure. He denies any cough or shortness of breath. His edema is improving. No changes in appetite; no reports of fevers present.   Past Medical History:  Diagnosis Date  . Acute on chronic congestive heart failure ([place]) 02/27/2017  . Arthritis    "knees, right elbow" (12/31/2016)  . Atrial fibrillation ([place])   . BPH (benign prostatic hyperplasia)   . CHF (congestive heart failure) ([place]) 12/31/2016  . CKD (chronic kidney disease), stage IV ([place])   . COPD (chronic obstructive pulmonary disease) ([place])   . Depression   . Diabetes mellitus ([place])   . Diabetes mellitus with renal complications ([place])   . Gout   . History of kidney stones   . Hypercholesterolemia   . Hypertension   . Obesity   . OSA on CPAP   . Peripheral vascular disease ([place])   . Type 2 diabetes, diet controlled ([place])     Past Surgical History:  Procedure Laterality Date  . HERNIA REPAIR    . INGUINAL HERNIA REPAIR Bilateral   . TEE WITH CARDIOVERSION  ~ 2007  . UMBILICAL HERNIA REPAIR      Social History   Socioeconomic History  . Marital status: Married    Spouse name: Not on file  . Number of children: Not on file  . Years of education: Not on file  . Highest education level: Not on file  Occupational History  . Not on file  Social Needs  . Financial resource  strain: Not on file  . Food insecurity:    Worry: Not on file    Inability: Not on file  . Transportation needs:    Medical: Not on file    Non-medical: Not on file  Tobacco Use  . Smoking status: Former Research scientist (life sciences)  . Smokeless tobacco: Never Used  Substance and Sexual Activity  . Alcohol use: Yes    Alcohol/week: 9.0 standard drinks    Types: 3 Glasses of wine, 3 Cans of beer, 3 Shots of liquor per week    Frequency: Never    Comment: 70.5 oz / week , occ  . Drug use: No  . Sexual activity: Not Currently  Lifestyle  . Physical activity:    Days per week: Not on file    Minutes per session: Not on file  . Stress: Not on file  Relationships  . Social connections:    Talks on phone: Not on file    Gets together: Not on file    Attends religious service: Not on file    Active member of club or organization: Not on file    Attends meetings of clubs or organizations: Not on file    Relationship status: Not on file  . Intimate partner violence:    Fear of current or ex partner: Not on file    Emotionally abused: Not  on file    Physically abused: Not on file    Forced sexual activity: Not on file  Other Topics Concern  . Not on file  Social History Narrative  . Not on file   Family History  Problem Relation Age of Onset  . Hypertension Mother   . Hypertension Father   . Sudden Cardiac Death Neg Hx       VITAL SIGNS BP 117/72   Pulse 76   Temp 98 F (36.7 C) (Oral)   Resp 18   Ht 5\' 11"  (1.803 m)   Wt (!) 303 lb (137.4 kg)   SpO2 99%   BMI 42.26 kg/m   Outpatient Encounter Medications as of 04/14/2018  Medication Sig  . acetaminophen (TYLENOL) 500 MG tablet Take 500 mg by mouth every 4 (four) hours as needed.  Marland Kitchen albuterol (PROVENTIL HFA;VENTOLIN HFA) 108 (90 Base) MCG/ACT inhaler Inhale 2 puffs into the lungs every 4 (four) hours as needed for wheezing or shortness of breath.  Marland Kitchen albuterol (PROVENTIL) (2.5 MG/3ML) 0.083% nebulizer solution Take 2.5 mg by  nebulization every 6 (six) hours as needed for wheezing or shortness of breath.  . allopurinol (ZYLOPRIM) 100 MG tablet Take 100 mg by mouth daily.  Marland Kitchen amLODipine (NORVASC) 10 MG tablet Take 10 mg by mouth daily.   . benzonatate (TESSALON) 100 MG capsule Take 100 mg by mouth 3 (three) times daily.  . Cholecalciferol (VITAMIN D3) 250 MCG (10000 UT) TABS Take 1,000 Units by mouth daily.   . fluticasone furoate-vilanterol (BREO ELLIPTA) 200-25 MCG/INH AEPB Inhale 1 puff into the lungs daily.  . furosemide (LASIX) 40 MG tablet Take 80 mg by mouth 2 (two) times daily.   Marland Kitchen guaifenesin (HUMIBID E) 400 MG TABS tablet Take 600 mg by mouth every 12 (twelve) hours.  . hydrALAZINE (APRESOLINE) 100 MG tablet Take 100 mg by mouth 3 (three) times daily. Take at the same time as isosrbide   . isosorbide dinitrate (ISORDIL) 40 MG tablet Take 40 mg by mouth 3 (three) times daily. Take at the same time as Hydralazine)  . metolazone (ZAROXOLYN) 5 MG tablet Take 5 mg by mouth every other day.  . metoprolol tartrate (LOPRESSOR) 25 MG tablet Take 12.5 mg by mouth 2 (two) times daily.  . nitroGLYCERIN (NITROSTAT) 0.4 MG SL tablet Place 0.4 mg under the tongue every 5 (five) minutes as needed for chest pain.  . pantoprazole (PROTONIX) 40 MG tablet Take 40 mg by mouth daily.  . potassium chloride (KLOR-CON) 20 MEQ packet Take by mouth daily.  . pravastatin (PRAVACHOL) 40 MG tablet Take 40 mg by mouth at bedtime.   . Skin Protectants, Misc. (EUCERIN) cream Apply 1 application topically 3 (three) times daily as needed for dry skin. On legs   . tamsulosin (FLOMAX) 0.4 MG CAPS capsule Take 0.4 mg by mouth daily after supper.  . umeclidinium bromide (INCRUSE ELLIPTA) 62.5 MCG/INH AEPB Inhale 1 puff into the lungs daily.  Marland Kitchen warfarin (COUMADIN) 5 MG tablet Take 5 mg by mouth See admin instructions. Daily except on Tuesday and Thursday  . warfarin (COUMADIN) 7.5 MG tablet Take 7.5 mg by mouth See admin instructions. Daily on  Tuesday and Thursday  . [DISCONTINUED] bisacodyl (DULCOLAX) 10 MG suppository Place 10 mg rectally as needed for moderate constipation. For up to 10 days.  . [DISCONTINUED] clotrimazole (LOTRIMIN) 1 % cream Apply 1 application topically 2 (two) times daily.  . [DISCONTINUED] isosorbide dinitrate (ISORDIL) 20 MG tablet Take 40 mg  by mouth 3 (three) times daily. Take at the same time as hydralazine  . [DISCONTINUED] nystatin (NYSTATIN) powder Apply topically 2 (two) times daily.   No facility-administered encounter medications on file as of 04/14/2018.      SIGNIFICANT DIAGNOSTIC EXAMS   LABS REVIEWED TODAY;   03-03-18: wbc 4.5; hgb 8.7; hct 26; plt 4.5; glucose 119; bun 107; creat 5.4; k+ 3.9; na++ 140   Review of Systems  Constitutional: Negative for malaise/fatigue.  Respiratory: Negative for cough and shortness of breath.   Cardiovascular: Positive for leg swelling. Negative for chest pain and palpitations.       Is better   Gastrointestinal: Negative for abdominal pain, constipation and heartburn.  Musculoskeletal: Negative for back pain, joint pain and myalgias.  Skin: Negative.   Neurological: Negative for dizziness.  Psychiatric/Behavioral: The patient is not nervous/anxious.     Physical Exam Constitutional:      General: He is not in acute distress.    Appearance: He is well-developed. He is obese. He is not diaphoretic.  Neck:     Musculoskeletal: Neck supple.     Thyroid: No thyromegaly.  Cardiovascular:     Rate and Rhythm: Normal rate and regular rhythm.     Pulses: Normal pulses.     Heart sounds: Murmur present.     Comments: 2/6 Pulmonary:     Effort: Pulmonary effort is normal. No respiratory distress.     Breath sounds: Normal breath sounds.  Abdominal:     General: Bowel sounds are normal. There is no distension.     Palpations: Abdomen is soft.     Tenderness: There is no abdominal tenderness.  Musculoskeletal:     Right lower leg: Edema present.      Left lower leg: Edema present.     Comments: Is able to move all extremities  Has 1-2+ bilateral lower extremity edema   Lymphadenopathy:     Cervical: No cervical adenopathy.  Skin:    General: Skin is warm and dry.     Comments: Bilateral lower extremities discolored Skin dry and flaky  Left upper extremity shunt without signs of infection present   Neurological:     Mental Status: He is alert and oriented to person, place, and time.  Psychiatric:        Mood and Affect: Mood normal.       ASSESSMENT/ PLAN:  TODAY  1. Atrial fibrillation, permanent: is stable will continue lopressor 12.5 mg twice daily for rate control and is on long term coumadin therapy.   2. 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 Is stable b/p 117/72: will continue norvasc 10 mg daily apresoline 100 mg three times daily; lopressor 12.5 mg twice daily zaroxolyn 5 mg every other daily isordil 40 mg three times daily has prn ntg   3. Chronic diastolic (congestive) heart failure: is stable will continue lasix 80 mg twice daily with k+ 20 meq daily; apresoline 100 mg three times daily lopressor 12.5 mg twice daily   4.  COPD exacerbation: is stable is on chronic 02. Will continue breo ellpta 200/25 mcg daily; incruse ellipta 62.5 daily; mucinex 600 mg twice daily tessalon 100 mg three times daily albuterol 2 puffs every 4 hours as needed or neb every6 hours as needed   5. Chronic kidney disease stage V : is without change awaiting for fistula to mature before beginning dialysis bun 107; creat 5.4  will monitor  6. BPH with symptoms absent: is stable will continue flomax 0.4 mg daily   7. Hyperlipidemia: unspecified hyperlipidemia: is stable will continue pravachol 40 mg daily   8. GERD without esophagitis: is stable will continue protonix 40 mg daily   9. 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 We will attempt to wean resident as apropriate   Ok Edwards NP [facility]  Contact [phone] Monday through Friday 8am- 5pm  After hours call [phone]

## 2018-04-15 DIAGNOSIS — I5032 Chronic diastolic (congestive) heart failure: Secondary | ICD-10-CM | POA: Insufficient documentation

## 2018-04-15 DIAGNOSIS — I132 Hypertensive heart and chronic kidney disease with heart failure and with stage 5 chronic kidney disease, or end stage renal disease: Secondary | ICD-10-CM | POA: Insufficient documentation

## 2018-04-15 DIAGNOSIS — K219 Gastro-esophageal reflux disease without esophagitis: Secondary | ICD-10-CM | POA: Insufficient documentation

## 2018-04-15 DIAGNOSIS — N185 Chronic kidney disease, stage 5: Secondary | ICD-10-CM

## 2018-04-16 ENCOUNTER — Non-Acute Institutional Stay (SKILLED_NURSING_FACILITY): Payer: Medicare Other | Admitting: Adult Health

## 2018-04-16 ENCOUNTER — Encounter: Payer: Self-pay | Admitting: Adult Health

## 2018-04-16 DIAGNOSIS — L03011 Cellulitis of right finger: Secondary | ICD-10-CM

## 2018-04-16 NOTE — Progress Notes (Signed)
Location:   [facility] Room Number: 101 Place of Service:  SNF (31)   CODE STATUS: full code   No Known Allergies  Chief Complaint  Patient presents with  . Acute Visit    right index finger infection    He has noted on his right index finger an infected area today . There is an abscess present the area is swollen and painful. There are no reports of fever present   Past Medical History:  Diagnosis Date  . Acute on chronic congestive heart failure ([place]) 02/27/2017  . Arthritis    "knees, right elbow" (12/31/2016)  . Atrial fibrillation ([place])   . BPH (benign prostatic hyperplasia)   . CHF (congestive heart failure) ([place]) 12/31/2016  . CKD (chronic kidney disease), stage IV ([place])   . COPD (chronic obstructive pulmonary disease) ([place])   . Depression   . Diabetes mellitus ([place])   . Diabetes mellitus with renal complications ([place])   . Gout   . History of kidney stones   . Hypercholesterolemia   . Hypertension   . Obesity   . OSA on CPAP   . Peripheral vascular disease ([place])   . Type 2 diabetes, diet controlled ([place])     Past Surgical History:  Procedure Laterality Date  . HERNIA REPAIR    . INGUINAL HERNIA REPAIR Bilateral   . TEE WITH CARDIOVERSION  ~ 2007  . UMBILICAL HERNIA REPAIR      Social History   Socioeconomic History  . Marital status: Married    Spouse name: Not on file  . Number of children: Not on file  . Years of education: Not on file  . Highest education level: Not on file  Occupational History  . Not on file  Social Needs  . Financial resource strain: Not on file  . Food insecurity:    Worry: Not on file    Inability: Not on file  . Transportation needs:    Medical: Not on file    Non-medical: Not on file  Tobacco Use  . Smoking status: Former Research scientist (life sciences)  . Smokeless tobacco: Never Used  Substance and Sexual Activity  . Alcohol use: Yes    Alcohol/week: 9.0 standard drinks    Types: 3 Glasses of wine, 3 Cans of beer, 3  Shots of liquor per week    Frequency: Never    Comment: 70.5 oz / week , occ  . Drug use: No  . Sexual activity: Not Currently  Lifestyle  . Physical activity:    Days per week: Not on file    Minutes per session: Not on file  . Stress: Not on file  Relationships  . Social connections:    Talks on phone: Not on file    Gets together: Not on file    Attends religious service: Not on file    Active member of club or organization: Not on file    Attends meetings of clubs or organizations: Not on file    Relationship status: Not on file  . Intimate partner violence:    Fear of current or ex partner: Not on file    Emotionally abused: Not on file    Physically abused: Not on file    Forced sexual activity: Not on file  Other Topics Concern  . Not on file  Social History Narrative  . Not on file   Family History  Problem Relation Age of Onset  . Hypertension Mother   .  Hypertension Father   . Sudden Cardiac Death Neg Hx       VITAL SIGNS BP 119/72   Pulse 80   Temp 97.9 F (36.6 C)   Ht 5\' 11"  (1.803 m)   Wt (!) 303 lb (137.4 kg)   BMI 42.26 kg/m   Outpatient Encounter Medications as of 04/16/2018  Medication Sig  . acetaminophen (TYLENOL) 500 MG tablet Take 500 mg by mouth every 4 (four) hours as needed.  Marland Kitchen albuterol (PROVENTIL HFA;VENTOLIN HFA) 108 (90 Base) MCG/ACT inhaler Inhale 2 puffs into the lungs every 4 (four) hours as needed for wheezing or shortness of breath.  Marland Kitchen albuterol (PROVENTIL) (2.5 MG/3ML) 0.083% nebulizer solution Take 2.5 mg by nebulization every 6 (six) hours as needed for wheezing or shortness of breath.  . allopurinol (ZYLOPRIM) 100 MG tablet Take 100 mg by mouth daily.  Marland Kitchen amLODipine (NORVASC) 10 MG tablet Take 10 mg by mouth daily.   . benzonatate (TESSALON) 100 MG capsule Take 100 mg by mouth 3 (three) times daily.  . Cholecalciferol (VITAMIN D3) 250 MCG (10000 UT) TABS Take 1,000 Units by mouth daily.   . fluticasone furoate-vilanterol  (BREO ELLIPTA) 200-25 MCG/INH AEPB Inhale 1 puff into the lungs daily.  . furosemide (LASIX) 40 MG tablet Take 80 mg by mouth 2 (two) times daily.   Marland Kitchen guaifenesin (HUMIBID E) 400 MG TABS tablet Take 600 mg by mouth every 12 (twelve) hours.  . hydrALAZINE (APRESOLINE) 100 MG tablet Take 100 mg by mouth 3 (three) times daily. Take at the same time as isosrbide   . isosorbide dinitrate (ISORDIL) 40 MG tablet Take 40 mg by mouth 3 (three) times daily. Take at the same time as Hydralazine)  . metolazone (ZAROXOLYN) 5 MG tablet Take 5 mg by mouth every other day.  . metoprolol tartrate (LOPRESSOR) 25 MG tablet Take 12.5 mg by mouth 2 (two) times daily.  . nitroGLYCERIN (NITROSTAT) 0.4 MG SL tablet Place 0.4 mg under the tongue every 5 (five) minutes as needed for chest pain.  . pantoprazole (PROTONIX) 40 MG tablet Take 40 mg by mouth daily.  . potassium chloride (KLOR-CON) 20 MEQ packet Take by mouth daily.  . pravastatin (PRAVACHOL) 40 MG tablet Take 40 mg by mouth at bedtime.   . Skin Protectants, Misc. (EUCERIN) cream Apply 1 application topically 3 (three) times daily as needed for dry skin. On legs   . tamsulosin (FLOMAX) 0.4 MG CAPS capsule Take 0.4 mg by mouth daily after supper.  . umeclidinium bromide (INCRUSE ELLIPTA) 62.5 MCG/INH AEPB Inhale 1 puff into the lungs daily.  Marland Kitchen warfarin (COUMADIN) 5 MG tablet Take 5 mg by mouth See admin instructions. Daily except on Tuesday and Thursday  . warfarin (COUMADIN) 7.5 MG tablet Take 7.5 mg by mouth See admin instructions. Daily on Tuesday and Thursday   No facility-administered encounter medications on file as of 04/16/2018.      SIGNIFICANT DIAGNOSTIC EXAMS   LABS REVIEWED PREVIOUS;   03-03-18: wbc 4.5; hgb 8.7; hct 26; plt 4.5; glucose 119; bun 107; creat 5.4; k+ 3.9; na++ 140   NO NEW LABS.    Review of Systems  Constitutional: Negative for malaise/fatigue.  Respiratory: Negative for cough and shortness of breath.   Cardiovascular:  Positive for leg swelling. Negative for chest pain and palpitations.  Gastrointestinal: Negative for abdominal pain, constipation and heartburn.  Musculoskeletal: Negative for back pain, joint pain and myalgias.  Skin:       Right index finger  with an abscess present   Neurological: Negative for dizziness.  Psychiatric/Behavioral: The patient is not nervous/anxious.      Physical Exam Constitutional:      General: He is not in acute distress.    Appearance: He is well-developed. He is obese. He is not diaphoretic.  Neck:     Musculoskeletal: Neck supple.     Thyroid: No thyromegaly.  Cardiovascular:     Rate and Rhythm: Normal rate and regular rhythm.     Pulses: Normal pulses.     Heart sounds: Murmur present.     Comments: 2/6 Pulmonary:     Effort: Pulmonary effort is normal. No respiratory distress.     Breath sounds: Normal breath sounds.  Abdominal:     General: Bowel sounds are normal. There is no distension.     Palpations: Abdomen is soft.     Tenderness: There is no abdominal tenderness.  Musculoskeletal:     Right lower leg: Edema present.     Left lower leg: Edema present.     Comments: Is able to move all extremities  Has 1-2+ bilateral lower extremity edema   Lymphadenopathy:     Cervical: No cervical adenopathy.  Skin:    General: Skin is warm and dry.     Comments: Bilateral lower extremities discolored Skin dry and flaky  Left upper extremity shunt without signs of infection present  Right index finger: abscess present; swollen; red warm and painful to touch   Neurological:     Mental Status: He is alert and oriented to person, place, and time.  Psychiatric:        Mood and Affect: Mood normal.       ASSESSMENT/ PLAN:  TODAY:   1. Right index finger felon: is worse: will begin doxycycline 100 mg twice daily for 10 days with probiotic twice daily will monitor his status.   MD is aware of resident's narcotic use and is in agreement with current  plan of care. We will attempt to wean resident as apropriate   Ok Edwards NP [facility]  Contact [phone] Monday through Friday 8am- 5pm  After hours call [phone]

## 2018-04-19 ENCOUNTER — Encounter: Payer: Self-pay | Admitting: Adult Health

## 2018-04-19 ENCOUNTER — Non-Acute Institutional Stay (SKILLED_NURSING_FACILITY): Payer: Medicare Other | Admitting: Adult Health

## 2018-04-19 DIAGNOSIS — Z7901 Long term (current) use of anticoagulants: Secondary | ICD-10-CM | POA: Diagnosis not present

## 2018-04-19 DIAGNOSIS — Z5181 Encounter for therapeutic drug level monitoring: Secondary | ICD-10-CM | POA: Diagnosis not present

## 2018-04-19 DIAGNOSIS — I4821 Permanent atrial fibrillation: Secondary | ICD-10-CM

## 2018-04-19 NOTE — Progress Notes (Signed)
Location:    [facility] Room Number: 101P Place of Service:  SNF (31)   CODE STATUS: CPR  No Known Allergies  Chief Complaint  Patient presents with  . Acute Visit    INR    HPI:  He is on long term coumadin therapy for afib. His INR today is 3.2 the goal of his INR is 2-3. There are no reports of missed coumadin dose. He denies any palpitations; no chest pain; no shortness of breath. No reports of bleeding present.   Past Medical History:  Diagnosis Date  . Acute on chronic congestive heart failure ([place]) 02/27/2017  . Arthritis    "knees, right elbow" (12/31/2016)  . Atrial fibrillation ([place])   . BPH (benign prostatic hyperplasia)   . CHF (congestive heart failure) ([place]) 12/31/2016  . CKD (chronic kidney disease), stage IV ([place])   . COPD (chronic obstructive pulmonary disease) ([place])   . Depression   . Diabetes mellitus ([place])   . Diabetes mellitus with renal complications ([place])   . Gout   . History of kidney stones   . Hypercholesterolemia   . Hypertension   . Obesity   . OSA on CPAP   . Peripheral vascular disease ([place])   . Type 2 diabetes, diet controlled ([place])     Past Surgical History:  Procedure Laterality Date  . HERNIA REPAIR    . INGUINAL HERNIA REPAIR Bilateral   . TEE WITH CARDIOVERSION  ~ 2007  . UMBILICAL HERNIA REPAIR      Social History   Socioeconomic History  . Marital status: Married    Spouse name: Not on file  . Number of children: Not on file  . Years of education: Not on file  . Highest education level: Not on file  Occupational History  . Not on file  Social Needs  . Financial resource strain: Not on file  . Food insecurity:    Worry: Not on file    Inability: Not on file  . Transportation needs:    Medical: Not on file    Non-medical: Not on file  Tobacco Use  . Smoking status: Former Research scientist (life sciences)  . Smokeless tobacco: Never Used  Substance and Sexual Activity  . Alcohol use: Yes   Alcohol/week: 9.0 standard drinks    Types: 3 Glasses of wine, 3 Cans of beer, 3 Shots of liquor per week    Frequency: Never    Comment: 70.5 oz / week , occ  . Drug use: No  . Sexual activity: Not Currently  Lifestyle  . Physical activity:    Days per week: Not on file    Minutes per session: Not on file  . Stress: Not on file  Relationships  . Social connections:    Talks on phone: Not on file    Gets together: Not on file    Attends religious service: Not on file    Active member of club or organization: Not on file    Attends meetings of clubs or organizations: Not on file    Relationship status: Not on file  . Intimate partner violence:    Fear of current or ex partner: Not on file    Emotionally abused: Not on file    Physically abused: Not on file    Forced sexual activity: Not on file  Other Topics Concern  . Not on file  Social History Narrative  . Not on file   Family History  Problem Relation Age of Onset  . Hypertension Mother   . Hypertension Father   . Sudden Cardiac Death Neg Hx       VITAL SIGNS BP 122/68   Pulse 66   Temp 98 F (36.7 C)   Resp 18   Ht 5\' 11"  (1.803 m)   Wt (!) 303 lb (137.4 kg)   BMI 42.26 kg/m   Outpatient Encounter Medications as of 04/19/2018  Medication Sig  . acetaminophen (TYLENOL) 500 MG tablet Take 500 mg by mouth every 4 (four) hours as needed.  Marland Kitchen albuterol (PROVENTIL HFA;VENTOLIN HFA) 108 (90 Base) MCG/ACT inhaler Inhale 2 puffs into the lungs every 4 (four) hours as needed for wheezing or shortness of breath.  Marland Kitchen albuterol (PROVENTIL) (2.5 MG/3ML) 0.083% nebulizer solution Take 2.5 mg by nebulization every 6 (six) hours as needed for wheezing or shortness of breath.  . allopurinol (ZYLOPRIM) 100 MG tablet Take 100 mg by mouth daily.  Marland Kitchen amLODipine (NORVASC) 10 MG tablet Take 10 mg by mouth daily.   . benzonatate (TESSALON) 100 MG capsule Take 100 mg by mouth 3 (three) times daily.  . Cholecalciferol (VITAMIN D3) 250  MCG (10000 UT) TABS Take 1,000 Units by mouth daily.   Marland Kitchen doxycycline (DORYX) 100 MG EC tablet Take 100 mg by mouth. ADMINISTER 1 TAB PO TWICE DAILY X 10 DAYS FOR RIGHT INDEX FINGER  . fluticasone furoate-vilanterol (BREO ELLIPTA) 200-25 MCG/INH AEPB Inhale 1 puff into the lungs daily.  . furosemide (LASIX) 40 MG tablet Take 80 mg by mouth 2 (two) times daily.   Marland Kitchen guaifenesin (HUMIBID E) 400 MG TABS tablet Take 600 mg by mouth every 12 (twelve) hours.  . hydrALAZINE (APRESOLINE) 100 MG tablet Take 100 mg by mouth 3 (three) times daily. Take at the same time as isosrbide   . isosorbide dinitrate (ISORDIL) 40 MG tablet Take 40 mg by mouth 3 (three) times daily. Take at the same time as Hydralazine)  . metolazone (ZAROXOLYN) 5 MG tablet Take 5 mg by mouth every other day.  . metoprolol tartrate (LOPRESSOR) 25 MG tablet Take 12.5 mg by mouth 2 (two) times daily.  . nitroGLYCERIN (NITROSTAT) 0.4 MG SL tablet Place 0.4 mg under the tongue every 5 (five) minutes as needed for chest pain.  . pantoprazole (PROTONIX) 40 MG tablet Take 40 mg by mouth daily.  . potassium chloride (KLOR-CON) 20 MEQ packet Take by mouth daily.  . pravastatin (PRAVACHOL) 40 MG tablet Take 40 mg by mouth at bedtime.   . saccharomyces boulardii (FLORASTOR) 250 MG capsule Take 250 mg by mouth. TAKE 1 CAPSULE PO TWICE DAILY X 2 WEEKS  . Skin Protectants, Misc. (EUCERIN) cream Apply 1 application topically 3 (three) times daily as needed for dry skin. On legs   . tamsulosin (FLOMAX) 0.4 MG CAPS capsule Take 0.4 mg by mouth daily after supper.  . umeclidinium bromide (INCRUSE ELLIPTA) 62.5 MCG/INH AEPB Inhale 1 puff into the lungs daily.  Marland Kitchen warfarin (COUMADIN) 5 MG tablet Take 5 mg by mouth See admin instructions. Daily except on Tuesday and Thursday  . warfarin (COUMADIN) 7.5 MG tablet Take 7.5 mg by mouth See admin instructions. Daily on Tuesday and Thursday   No facility-administered encounter medications on file as of 04/19/2018.       SIGNIFICANT DIAGNOSTIC EXAMS   LABS REVIEWED PREVIOUS;   03-03-18: wbc 4.5; hgb 8.7; hct 26; plt 4.5; glucose 119; bun 107; creat 5.4; k+ 3.9; na++ 140   TODAY:  04-19-18: inr 3.2    Review of Systems  Constitutional: Negative for malaise/fatigue.  Respiratory: Negative for cough and shortness of breath.   Cardiovascular: Negative for chest pain, palpitations and leg swelling.  Gastrointestinal: Negative for abdominal pain, constipation and heartburn.  Musculoskeletal: Negative for back pain, joint pain and myalgias.  Skin:       Index finger is improving   Neurological: Negative for dizziness.  Psychiatric/Behavioral: The patient is not nervous/anxious.      Physical Exam Constitutional:      General: He is not in acute distress.    Appearance: He is well-developed. He is obese. He is not diaphoretic.  Neck:     Musculoskeletal: Neck supple.     Thyroid: No thyromegaly.  Cardiovascular:     Rate and Rhythm: Normal rate and regular rhythm.     Pulses: Normal pulses.     Heart sounds: Normal heart sounds.  Pulmonary:     Effort: Pulmonary effort is normal. No respiratory distress.     Breath sounds: Normal breath sounds.  Abdominal:     General: Bowel sounds are normal. There is no distension.     Palpations: Abdomen is soft.     Tenderness: There is no abdominal tenderness.  Musculoskeletal:     Right lower leg: Edema present.     Left lower leg: Edema present.     Comments: Is able to move all extremities  Has 1-2+ bilateral lower extremity edema    Lymphadenopathy:     Cervical: No cervical adenopathy.  Skin:    General: Skin is warm and dry.     Comments: Bilateral lower extremities discolored Skin dry and flaky  Left upper extremity shunt without signs of infection present   Neurological:     Mental Status: He is alert and oriented to person, place, and time.  Psychiatric:        Mood and Affect: Mood normal.       ASSESSMENT/ PLAN:  TODAY:    1. Atrial fibrillation, permanent  2. Anticoagulation management encounter  Will continue lopressor 12.5 mg twice daily for rate control Will hold coumadin today; will then resume coumadin 5 mg daily with exception of Tuesday and Thursday Will check INR in one week.     MD is aware of resident's narcotic use and is in agreement with current plan of care. We will attempt to wean resident as apropriate   Ok Edwards NP [facility]  Contact [phone] Monday through Friday 8am- 5pm  After hours call [phone]

## 2018-04-21 ENCOUNTER — Non-Acute Institutional Stay (SKILLED_NURSING_FACILITY): Payer: Medicare Other | Admitting: Adult Health

## 2018-04-21 ENCOUNTER — Encounter: Payer: Self-pay | Admitting: Adult Health

## 2018-04-21 DIAGNOSIS — N185 Chronic kidney disease, stage 5: Secondary | ICD-10-CM

## 2018-04-21 DIAGNOSIS — N400 Enlarged prostate without lower urinary tract symptoms: Secondary | ICD-10-CM

## 2018-04-21 DIAGNOSIS — Z5181 Encounter for therapeutic drug level monitoring: Secondary | ICD-10-CM | POA: Insufficient documentation

## 2018-04-21 DIAGNOSIS — Z7901 Long term (current) use of anticoagulants: Secondary | ICD-10-CM

## 2018-04-21 DIAGNOSIS — J441 Chronic obstructive pulmonary disease with (acute) exacerbation: Secondary | ICD-10-CM | POA: Diagnosis not present

## 2018-04-21 NOTE — Progress Notes (Signed)
Location:   [facility] Room Number: 101P Place of Service:  SNF (31)   CODE STATUS: FULL  No Known Allergies  Chief Complaint  Patient presents with  . Medical Management of Chronic Issues    COPD exacerbation; chronic kidney disease stage V: benign prostatic hyperplasia without lower urinary tract symptoms. Weekly follow up for the first 30 days post hospitalization     HPI:  He is  73 year old short term rehab patient being seen for the management of his chronic illnesses: copd; ckd stage V; bph. He denies any uncontrolled pain; no changes in appetite; no cough or shortness of breath.   Past Medical History:  Diagnosis Date  . Acute on chronic congestive heart failure ([place]) 02/27/2017  . Arthritis    "knees, right elbow" (12/31/2016)  . Atrial fibrillation ([place])   . BPH (benign prostatic hyperplasia)   . CHF (congestive heart failure) ([place]) 12/31/2016  . CKD (chronic kidney disease), stage IV ([place])   . COPD (chronic obstructive pulmonary disease) ([place])   . Depression   . Diabetes mellitus ([place])   . Diabetes mellitus with renal complications ([place])   . Gout   . History of kidney stones   . Hypercholesterolemia   . Hypertension   . Obesity   . OSA on CPAP   . Peripheral vascular disease ([place])   . Type 2 diabetes, diet controlled ([place])     Past Surgical History:  Procedure Laterality Date  . HERNIA REPAIR    . INGUINAL HERNIA REPAIR Bilateral   . TEE WITH CARDIOVERSION  ~ 2007  . UMBILICAL HERNIA REPAIR      Social History   Socioeconomic History  . Marital status: Married    Spouse name: Not on file  . Number of children: Not on file  . Years of education: Not on file  . Highest education level: Not on file  Occupational History  . Not on file  Social Needs  . Financial resource strain: Not on file  . Food insecurity:    Worry: Not on file    Inability: Not on file  . Transportation needs:    Medical: Not on file   Non-medical: Not on file  Tobacco Use  . Smoking status: Former Research scientist (life sciences)  . Smokeless tobacco: Never Used  Substance and Sexual Activity  . Alcohol use: Yes    Alcohol/week: 9.0 standard drinks    Types: 3 Glasses of wine, 3 Cans of beer, 3 Shots of liquor per week    Frequency: Never    Comment: 70.5 oz / week , occ  . Drug use: No  . Sexual activity: Not Currently  Lifestyle  . Physical activity:    Days per week: Not on file    Minutes per session: Not on file  . Stress: Not on file  Relationships  . Social connections:    Talks on phone: Not on file    Gets together: Not on file    Attends religious service: Not on file    Active member of club or organization: Not on file    Attends meetings of clubs or organizations: Not on file    Relationship status: Not on file  . Intimate partner violence:    Fear of current or ex partner: Not on file    Emotionally abused: Not on file    Physically abused: Not on file    Forced sexual activity: Not on file  Other Topics  Concern  . Not on file  Social History Narrative  . Not on file   Family History  Problem Relation Age of Onset  . Hypertension Mother   . Hypertension Father   . Sudden Cardiac Death Neg Hx       VITAL SIGNS BP 127/76   Pulse 67   Temp (!) 97.1 F (36.2 C) ([place])   Resp 18   Ht 5\' 11"  (1.803 m)   Wt (!) 303 lb (137.4 kg)   SpO2 96%   BMI 42.26 kg/m   Outpatient Encounter Medications as of 04/21/2018  Medication Sig  . acetaminophen (TYLENOL) 500 MG tablet Take 500 mg by mouth every 4 (four) hours as needed.   Marland Kitchen albuterol (PROVENTIL HFA;VENTOLIN HFA) 108 (90 Base) MCG/ACT inhaler Inhale 2 puffs into the lungs every 4 (four) hours as needed for wheezing or shortness of breath.  Marland Kitchen albuterol (PROVENTIL) (2.5 MG/3ML) 0.083% nebulizer solution Take 2.5 mg by nebulization every 6 (six) hours as needed for wheezing or shortness of breath.  . allopurinol (ZYLOPRIM) 100 MG tablet Take 100 mg by mouth daily.   Marland Kitchen amLODipine (NORVASC) 10 MG tablet Take 10 mg by mouth daily.   . benzonatate (TESSALON) 100 MG capsule Take 100 mg by mouth 3 (three) times daily.  . cholecalciferol (VITAMIN D) 25 MCG (1000 UT) tablet Take 1,000 Units by mouth daily.  Marland Kitchen doxycycline (DORYX) 100 MG EC tablet Take 100 mg by mouth. ADMINISTER 1 TAB PO TWICE DAILY X 10 DAYS FOR RIGHT INDEX FINGER  . fluticasone furoate-vilanterol (BREO ELLIPTA) 200-25 MCG/INH AEPB Inhale 1 puff into the lungs daily.  . furosemide (LASIX) 40 MG tablet Take 80 mg by mouth 2 (two) times daily.   Marland Kitchen guaiFENesin (MUCINEX) 600 MG 12 hr tablet Take 600 mg by mouth 2 (two) times daily.  . hydrALAZINE (APRESOLINE) 100 MG tablet Take 100 mg by mouth 3 (three) times daily. Take at the same time as isosrbide   . isosorbide dinitrate (ISORDIL) 40 MG tablet Take 40 mg by mouth 3 (three) times daily. Take at the same time as Hydralazine)  . metolazone (ZAROXOLYN) 5 MG tablet Take 5 mg by mouth every other day.  . metoprolol tartrate (LOPRESSOR) 25 MG tablet Take 12.5 mg by mouth 2 (two) times daily.  . nitroGLYCERIN (NITROSTAT) 0.4 MG SL tablet Place 0.4 mg under the tongue every 5 (five) minutes as needed for chest pain.  . pantoprazole (PROTONIX) 40 MG tablet Take 40 mg by mouth daily.  . potassium chloride SA (K-DUR,KLOR-CON) 20 MEQ tablet Take 20 mEq by mouth daily.  . pravastatin (PRAVACHOL) 40 MG tablet Take 40 mg by mouth at bedtime.   . saccharomyces boulardii (FLORASTOR) 250 MG capsule Take 250 mg by mouth. TAKE 1 CAPSULE PO TWICE DAILY X 2 WEEKS  . Skin Protectants, Misc. (EUCERIN) cream Apply 1 application topically 3 (three) times daily as needed for dry skin. On legs   . tamsulosin (FLOMAX) 0.4 MG CAPS capsule Take 0.4 mg by mouth daily after supper.  . umeclidinium bromide (INCRUSE ELLIPTA) 62.5 MCG/INH AEPB Inhale 1 puff into the lungs daily.  Marland Kitchen warfarin (COUMADIN) 5 MG tablet Take 5 mg by mouth See admin instructions. Daily except on Tuesday and  Thursday  . warfarin (COUMADIN) 7.5 MG tablet Take 7.5 mg by mouth See admin instructions. Daily on Tuesday and Thursday  . [DISCONTINUED] Cholecalciferol (VITAMIN D3) 250 MCG (10000 UT) TABS Take 1,000 Units by mouth daily.   . [  DISCONTINUED] guaifenesin (HUMIBID E) 400 MG TABS tablet Take 600 mg by mouth every 12 (twelve) hours.  . [DISCONTINUED] potassium chloride (KLOR-CON) 20 MEQ packet Take by mouth daily.   No facility-administered encounter medications on file as of 04/21/2018.      SIGNIFICANT DIAGNOSTIC EXAMS   LABS REVIEWED PREVIOUS;   03-03-18: wbc 4.5; hgb 8.7; hct 26; plt 4.5; glucose 119; bun 107; creat 5.4; k+ 3.9; na++ 140  04-19-18: inr 3.2    NO NEW LABS.   Review of Systems  Constitutional: Negative for malaise/fatigue.  Respiratory: Negative for cough and shortness of breath.   Cardiovascular: Negative for chest pain, palpitations and leg swelling.  Gastrointestinal: Negative for abdominal pain, constipation and heartburn.  Musculoskeletal: Negative for back pain, joint pain and myalgias.  Skin: Negative.   Neurological: Negative for dizziness.  Psychiatric/Behavioral: The patient is not nervous/anxious.     Physical Exam Constitutional:      General: He is not in acute distress.    Appearance: He is well-developed. He is obese. He is not diaphoretic.  Neck:     Musculoskeletal: Neck supple.     Thyroid: No thyromegaly.  Cardiovascular:     Rate and Rhythm: Normal rate and regular rhythm.     Pulses: Normal pulses.     Heart sounds: Normal heart sounds.  Pulmonary:     Effort: Pulmonary effort is normal. No respiratory distress.     Breath sounds: Normal breath sounds.  Abdominal:     General: Bowel sounds are normal. There is no distension.     Palpations: Abdomen is soft.     Tenderness: There is no abdominal tenderness.  Musculoskeletal:     Right lower leg: Edema present.     Left lower leg: Edema present.     Comments: Is able to move all  extremities  Has 1-2+ bilateral lower extremity edema     Lymphadenopathy:     Cervical: No cervical adenopathy.  Skin:    General: Skin is warm and dry.     Comments: Bilateral lower extremities discolored Skin dry and flaky  Left upper extremity shunt without signs of infection present    Neurological:     Mental Status: He is alert and oriented to person, place, and time.  Psychiatric:        Mood and Affect: Mood normal.       ASSESSMENT/ PLAN:  TODAY  1. COPD exacerbation: is stable will continue breo elipta 200/25 mcg daily; incruse ellipta 62.5 mcg daily mucinex 600 mg twice daily tesselon 100 mg three times daily albuterol 2 puffs every 4 hours as needed or neb every 6 hours as needed  2. Chronic kidney disease stage V: is without change awaiting fistula to mature before beginning dialysis will monitor  bun 107; creat 5.4   3. BPH with symptoms absent is stable will continue flomax 0.4 mg daily    TODAY:   4. Atrial fibrillation, permanent: is stable will continue lopressor 12.5 mg twice daily for rate control and is on long term coumadin therapy.   5. 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 Is stable b/p 127/76: will continue norvasc 10 mg daily apresoline 100 mg three times daily; lopressor 12.5 mg twice daily zaroxolyn 5 mg every other daily isordil 40 mg three times daily has prn ntg   6. Chronic diastolic (congestive) heart failure: is stable will continue lasix 80  mg twice daily with k+ 20 meq daily; apresoline 100 mg three times daily lopressor 12.5 mg twice daily   7. Hyperlipidemia: unspecified hyperlipidemia: is stable will continue pravachol 40 mg daily   8. GERD without esophagitis: is stable will continue protonix 40 mg daily   9. 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 We will attempt to wean resident as apropriate   Ok Edwards NP [facility]  Contact [phone] Monday through Friday 8am- 5pm  After hours call [phone]

## 2018-04-26 ENCOUNTER — Other Ambulatory Visit: Payer: Self-pay | Admitting: Adult Health

## 2018-04-26 ENCOUNTER — Non-Acute Institutional Stay (SKILLED_NURSING_FACILITY): Payer: Medicare Other | Admitting: Adult Health

## 2018-04-26 ENCOUNTER — Encounter: Payer: Self-pay | Admitting: Adult Health

## 2018-04-26 DIAGNOSIS — I132 Hypertensive heart and chronic kidney disease with heart failure and with stage 5 chronic kidney disease, or end stage renal disease: Secondary | ICD-10-CM | POA: Diagnosis not present

## 2018-04-26 DIAGNOSIS — J441 Chronic obstructive pulmonary disease with (acute) exacerbation: Secondary | ICD-10-CM

## 2018-04-26 DIAGNOSIS — N185 Chronic kidney disease, stage 5: Secondary | ICD-10-CM | POA: Diagnosis not present

## 2018-04-26 DIAGNOSIS — I5032 Chronic diastolic (congestive) heart failure: Secondary | ICD-10-CM

## 2018-04-26 MED ORDER — FLUTICASONE FUROATE-VILANTEROL 200-25 MCG/INH IN AEPB: 1.0000 | INHALATION_SPRAY | Freq: Every day | RESPIRATORY_TRACT | 0 refills | Status: AC

## 2018-04-26 MED ORDER — ALLOPURINOL 100 MG PO TABS: 100.0000 mg | ORAL_TABLET | Freq: Every day | ORAL | 0 refills | Status: AC

## 2018-04-26 MED ORDER — NITROGLYCERIN 0.4 MG SL SUBL: 0.4000 mg | SUBLINGUAL_TABLET | SUBLINGUAL | 0 refills | Status: AC | PRN

## 2018-04-26 MED ORDER — DOXYCYCLINE HYCLATE 100 MG PO TBEC: 100.0000 mg | DELAYED_RELEASE_TABLET | Freq: Two times a day (BID) | ORAL | 0 refills | Status: AC

## 2018-04-26 MED ORDER — HYDRALAZINE HCL 100 MG PO TABS: 100.0000 mg | ORAL_TABLET | Freq: Three times a day (TID) | ORAL | 0 refills | Status: AC

## 2018-04-26 MED ORDER — PANTOPRAZOLE SODIUM 40 MG PO TBEC: 40.0000 mg | DELAYED_RELEASE_TABLET | Freq: Every day | ORAL | 0 refills | Status: AC

## 2018-04-26 MED ORDER — PRAVASTATIN SODIUM 40 MG PO TABS: 40.0000 mg | ORAL_TABLET | Freq: Every day | ORAL | 0 refills | Status: AC

## 2018-04-26 MED ORDER — BENZONATATE 100 MG PO CAPS: 100.0000 mg | ORAL_CAPSULE | Freq: Three times a day (TID) | ORAL | 0 refills | Status: AC

## 2018-04-26 MED ORDER — UMECLIDINIUM BROMIDE 62.5 MCG/INH IN AEPB: 1.0000 | INHALATION_SPRAY | Freq: Every day | RESPIRATORY_TRACT | 0 refills | Status: AC

## 2018-04-26 MED ORDER — WARFARIN SODIUM 7.5 MG PO TABS: 7.5000 mg | ORAL_TABLET | ORAL | 0 refills | Status: AC

## 2018-04-26 MED ORDER — METOPROLOL TARTRATE 25 MG PO TABS: 12.5000 mg | ORAL_TABLET | Freq: Two times a day (BID) | ORAL | 0 refills | Status: AC

## 2018-04-26 MED ORDER — AMLODIPINE BESYLATE 10 MG PO TABS: 10.0000 mg | ORAL_TABLET | Freq: Every day | ORAL | 0 refills | Status: AC

## 2018-04-26 MED ORDER — ISOSORBIDE DINITRATE 40 MG PO TABS: 40.0000 mg | ORAL_TABLET | Freq: Three times a day (TID) | ORAL | 0 refills | Status: AC

## 2018-04-26 MED ORDER — TAMSULOSIN HCL 0.4 MG PO CAPS: 0.4000 mg | ORAL_CAPSULE | Freq: Every day | ORAL | 0 refills | Status: AC

## 2018-04-26 MED ORDER — WARFARIN SODIUM 5 MG PO TABS: 5.0000 mg | ORAL_TABLET | ORAL | 0 refills | Status: AC

## 2018-04-26 MED ORDER — ALBUTEROL SULFATE HFA 108 (90 BASE) MCG/ACT IN AERS: 2.0000 | INHALATION_SPRAY | RESPIRATORY_TRACT | 0 refills | Status: AC | PRN

## 2018-04-26 MED ORDER — METOLAZONE 5 MG PO TABS: 5.0000 mg | ORAL_TABLET | ORAL | 0 refills | Status: AC

## 2018-04-26 MED ORDER — POTASSIUM CHLORIDE CRYS ER 20 MEQ PO TBCR: 20.0000 meq | EXTENDED_RELEASE_TABLET | Freq: Every day | ORAL | 0 refills | Status: AC

## 2018-04-26 MED ORDER — GUAIFENESIN ER 600 MG PO TB12: 600.0000 mg | ORAL_TABLET | Freq: Two times a day (BID) | ORAL | 0 refills | Status: AC

## 2018-04-26 MED ORDER — FUROSEMIDE 40 MG PO TABS: 80.0000 mg | ORAL_TABLET | Freq: Two times a day (BID) | ORAL | 0 refills | Status: AC

## 2018-04-26 NOTE — Progress Notes (Signed)
Location:   [facility] Room Number: 101P Place of Service:  SNF (31)    CODE STATUS: FULL  No Known Allergies  Chief Complaint  Patient presents with  . Discharge Note    Discharged from Facility on 04/27/2018    HPI:   He is being discharged to home with home health for pt/ot/rn. He will need home 02. He will need his prescriptions written and will need to follow up with his medical provider. He had been hospitalized for a fistula placement; had an exacerbation for copd. He was admitted to this facility for short term rehab. He is now ready to complete his therapy on a home health basis.    Past Medical History:  Diagnosis Date  . Acute on chronic congestive heart failure ([place]) 02/27/2017  . Arthritis    "knees, right elbow" (12/31/2016)  . Atrial fibrillation ([place])   . BPH (benign prostatic hyperplasia)   . CHF (congestive heart failure) ([place]) 12/31/2016  . CKD (chronic kidney disease), stage IV ([place])   . COPD (chronic obstructive pulmonary disease) ([place])   . Depression   . Diabetes mellitus ([place])   . Diabetes mellitus with renal complications ([place])   . Gout   . History of kidney stones   . Hypercholesterolemia   . Hypertension   . Obesity   . OSA on CPAP   . Peripheral vascular disease ([place])   . Type 2 diabetes, diet controlled ([place])     Past Surgical History:  Procedure Laterality Date  . HERNIA REPAIR    . INGUINAL HERNIA REPAIR Bilateral   . TEE WITH CARDIOVERSION  ~ 2007  . UMBILICAL HERNIA REPAIR      Social History   Socioeconomic History  . Marital status: Married    Spouse name: Not on file  . Number of children: Not on file  . Years of education: Not on file  . Highest education level: Not on file  Occupational History  . Not on file  Social Needs  . Financial resource strain: Not on file  . Food insecurity:    Worry: Not on file    Inability: Not on file  . Transportation needs:    Medical: Not on file   Non-medical: Not on file  Tobacco Use  . Smoking status: Former Research scientist (life sciences)  . Smokeless tobacco: Never Used  Substance and Sexual Activity  . Alcohol use: Yes    Alcohol/week: 9.0 standard drinks    Types: 3 Glasses of wine, 3 Cans of beer, 3 Shots of liquor per week    Frequency: Never    Comment: 70.5 oz / week , occ  . Drug use: No  . Sexual activity: Not Currently  Lifestyle  . Physical activity:    Days per week: Not on file    Minutes per session: Not on file  . Stress: Not on file  Relationships  . Social connections:    Talks on phone: Not on file    Gets together: Not on file    Attends religious service: Not on file    Active member of club or organization: Not on file    Attends meetings of clubs or organizations: Not on file    Relationship status: Not on file  . Intimate partner violence:    Fear of current or ex partner: Not on file    Emotionally abused: Not on file    Physically abused: Not on file    Forced  sexual activity: Not on file  Other Topics Concern  . Not on file  Social History Narrative  . Not on file   Family History  Problem Relation Age of Onset  . Hypertension Mother   . Hypertension Father   . Sudden Cardiac Death Neg Hx     VITAL SIGNS BP 126/80   Pulse 73   Temp (!) 97 F (36.1 C) (Oral)   Resp 20   Ht 5\' 10"  (1.778 m)   Wt 276 lb 8 oz (125.4 kg)   SpO2 96%   BMI 39.67 kg/m   Patient's Medications  New Prescriptions   No medications on file  Previous Medications   ACETAMINOPHEN (TYLENOL) 500 MG TABLET    Take 500 mg by mouth every 4 (four) hours as needed.    ALBUTEROL (PROVENTIL HFA;VENTOLIN HFA) 108 (90 BASE) MCG/ACT INHALER    Inhale 2 puffs into the lungs every 4 (four) hours as needed for wheezing or shortness of breath.   ALBUTEROL (PROVENTIL) (2.5 MG/3ML) 0.083% NEBULIZER SOLUTION    Take 2.5 mg by nebulization every 6 (six) hours as needed for wheezing or shortness of breath.   ALLOPURINOL (ZYLOPRIM) 100 MG TABLET     Take 100 mg by mouth daily.   AMLODIPINE (NORVASC) 10 MG TABLET    Take 10 mg by mouth daily.    BENZONATATE (TESSALON) 100 MG CAPSULE    Take 100 mg by mouth 3 (three) times daily.   CHOLECALCIFEROL (VITAMIN D) 25 MCG (1000 UT) TABLET    Take 1,000 Units by mouth daily.   DOXYCYCLINE (DORYX) 100 MG EC TABLET    Take 100 mg by mouth. ADMINISTER 1 TAB PO TWICE DAILY X 10 DAYS FOR RIGHT INDEX FINGER   FLUTICASONE FUROATE-VILANTEROL (BREO ELLIPTA) 200-25 MCG/INH AEPB    Inhale 1 puff into the lungs daily.   FUROSEMIDE (LASIX) 40 MG TABLET    Take 80 mg by mouth 2 (two) times daily.    GUAIFENESIN (MUCINEX) 600 MG 12 HR TABLET    Take 600 mg by mouth 2 (two) times daily.   HYDRALAZINE (APRESOLINE) 100 MG TABLET    Take 100 mg by mouth 3 (three) times daily. Take at the same time as isosrbide    ISOSORBIDE DINITRATE (ISORDIL) 40 MG TABLET    Take 40 mg by mouth 3 (three) times daily. Take at the same time as Hydralazine)   METOLAZONE (ZAROXOLYN) 5 MG TABLET    Take 5 mg by mouth every other day.   METOPROLOL TARTRATE (LOPRESSOR) 25 MG TABLET    Take 12.5 mg by mouth 2 (two) times daily.   NITROGLYCERIN (NITROSTAT) 0.4 MG SL TABLET    Place 0.4 mg under the tongue every 5 (five) minutes as needed for chest pain.   PANTOPRAZOLE (PROTONIX) 40 MG TABLET    Take 40 mg by mouth daily.   POTASSIUM CHLORIDE SA (K-DUR,KLOR-CON) 20 MEQ TABLET    Take 20 mEq by mouth daily.   PRAVASTATIN (PRAVACHOL) 40 MG TABLET    Take 40 mg by mouth at bedtime.    SACCHAROMYCES BOULARDII (FLORASTOR) 250 MG CAPSULE    Take 250 mg by mouth. TAKE 1 CAPSULE PO TWICE DAILY X 2 WEEKS   SKIN PROTECTANTS, MISC. (EUCERIN) CREAM    Apply 1 application topically 3 (three) times daily as needed for dry skin. On legs    TAMSULOSIN (FLOMAX) 0.4 MG CAPS CAPSULE    Take 0.4 mg by mouth daily after supper.  UMECLIDINIUM BROMIDE (INCRUSE ELLIPTA) 62.5 MCG/INH AEPB    Inhale 1 puff into the lungs daily.   WARFARIN (COUMADIN) 5 MG TABLET    Take  5 mg by mouth See admin instructions. Daily except on Tuesday and Thursday   WARFARIN (COUMADIN) 7.5 MG TABLET    Take 7.5 mg by mouth See admin instructions. Daily on Tuesday and Thursday  Modified Medications   No medications on file  Discontinued Medications   No medications on file     SIGNIFICANT DIAGNOSTIC EXAMS   LABS REVIEWED PREVIOUS;   03-03-18: wbc 4.5; hgb 8.7; hct 26; plt 4.5; glucose 119; bun 107; creat 5.4; k+ 3.9; na++ 140  04-19-18: inr 3.2    TODAY   04-26-18: INR 2.5    Review of Systems  Constitutional: Negative for malaise/fatigue.  Respiratory: Negative for cough and shortness of breath.   Cardiovascular: Negative for chest pain, palpitations and leg swelling.  Gastrointestinal: Negative for abdominal pain, constipation and heartburn.  Musculoskeletal: Negative for back pain, joint pain and myalgias.  Skin: Negative.   Neurological: Negative for dizziness.  Psychiatric/Behavioral: The patient is not nervous/anxious.     Physical Exam Constitutional:      General: He is not in acute distress.    Appearance: He is well-developed. He is obese. He is not diaphoretic.  Neck:     Musculoskeletal: Neck supple.     Thyroid: No thyromegaly.  Cardiovascular:     Rate and Rhythm: Normal rate and regular rhythm.     Pulses: Normal pulses.     Heart sounds: Normal heart sounds.  Pulmonary:     Effort: Pulmonary effort is normal. No respiratory distress.     Breath sounds: Normal breath sounds.     Comments: 02 dependent  Abdominal:     General: Bowel sounds are normal. There is no distension.     Palpations: Abdomen is soft.     Tenderness: There is no abdominal tenderness.  Musculoskeletal:     Right lower leg: Edema present.     Left lower leg: Edema present.     Comments: Is able to move all extremities  Has 1-2+ bilateral lower extremity edema      Lymphadenopathy:     Cervical: No cervical adenopathy.  Skin:    General: Skin is warm and dry.      Comments: Bilateral lower extremities discolored Skin dry and flaky  Left upper extremity shunt without signs of infection present     Neurological:     Mental Status: He is alert and oriented to person, place, and time.  Psychiatric:        Mood and Affect: Mood normal.      ASSESSMENT/ PLAN:   Patient is being discharged with the following home health services:  Pt/ot/rn: to evaluate and treat as indicated for gait balance strength adl training medication and INR management. INR DUE 05-03-18  Patient is being discharged with the following durable medical equipment:  Pt requires stationary gaseous and portable gaseous oxygen at 2 l/min via nasal cannula 02 sats with exertion is 88% with 02 at 2L is 99%.   Patient has been advised to f/u with their PCP in 1-2 weeks to bring them up to date on their rehab stay.  Social services at facility was responsible for arranging this appointment.  Pt was provided with a 30 day supply of prescriptions for medications and refills must be obtained from their PCP.  For controlled substances, a more limited  supply may be provided adequate until PCP appointment only.   A 30 day supply of his prescription medications have been sent to [facility] on [address] [facility].   Time spent with patient 40 minutes: discussed home health needs; dme and medications has verbalized understanding.    Ok Edwards NP [facility]  Contact [phone] Monday through Friday 8am- 5pm  After hours call [phone]

## 2018-06-01 ENCOUNTER — Other Ambulatory Visit: Payer: Self-pay | Admitting: Adult Health

## 2018-07-02 ENCOUNTER — Other Ambulatory Visit: Payer: Self-pay | Admitting: Adult Health

## 2018-07-10 ENCOUNTER — Other Ambulatory Visit: Payer: Self-pay | Admitting: Adult Health

## 2018-09-05 IMAGING — CR DG CHEST 2V
2 series · 2 of 2 positions shown · non-contrast
Comparison: None.

CLINICAL DATA: 71-year-old male with increasing shortness of breath
with exertion. Chronic cough.

EXAM:
CHEST  2 VIEW

[chest pa]
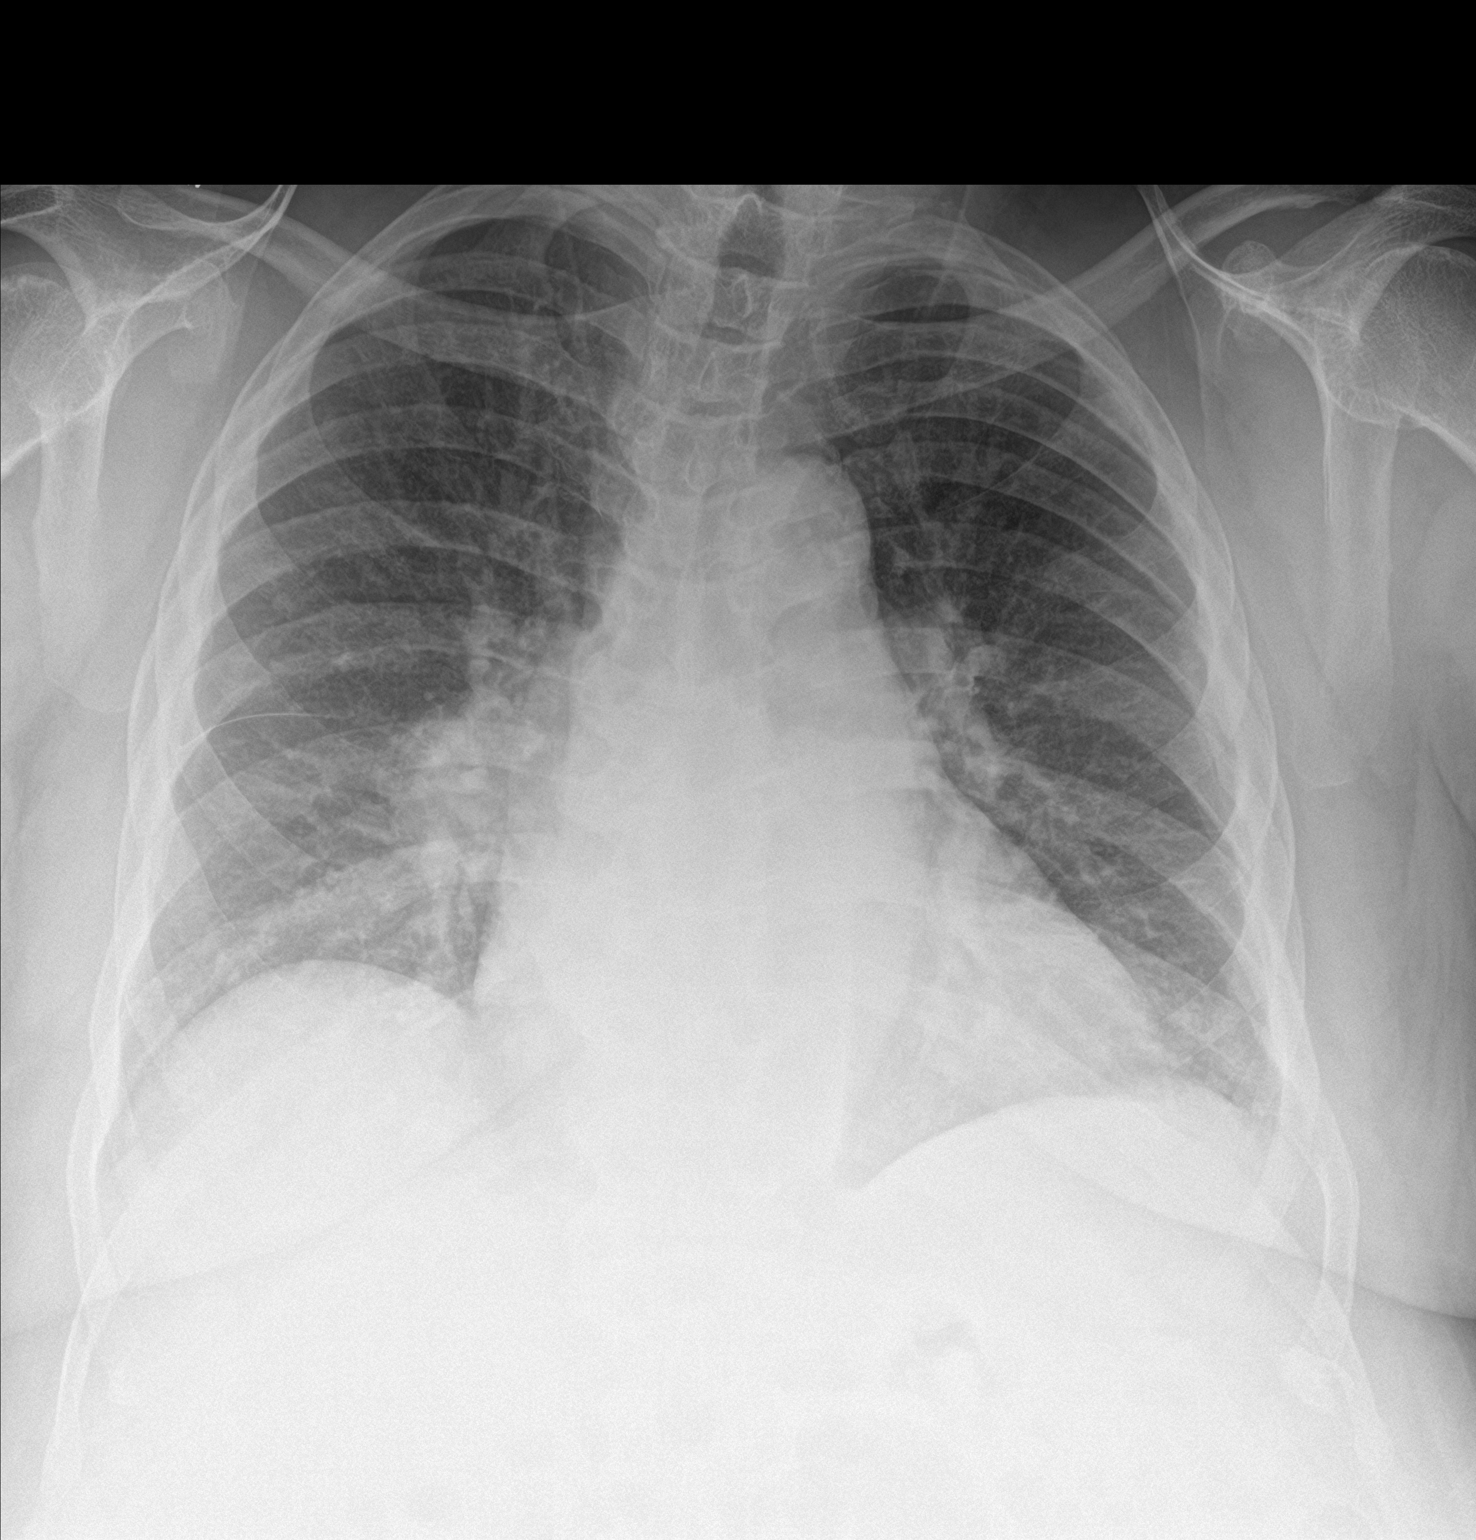

[chest lat]
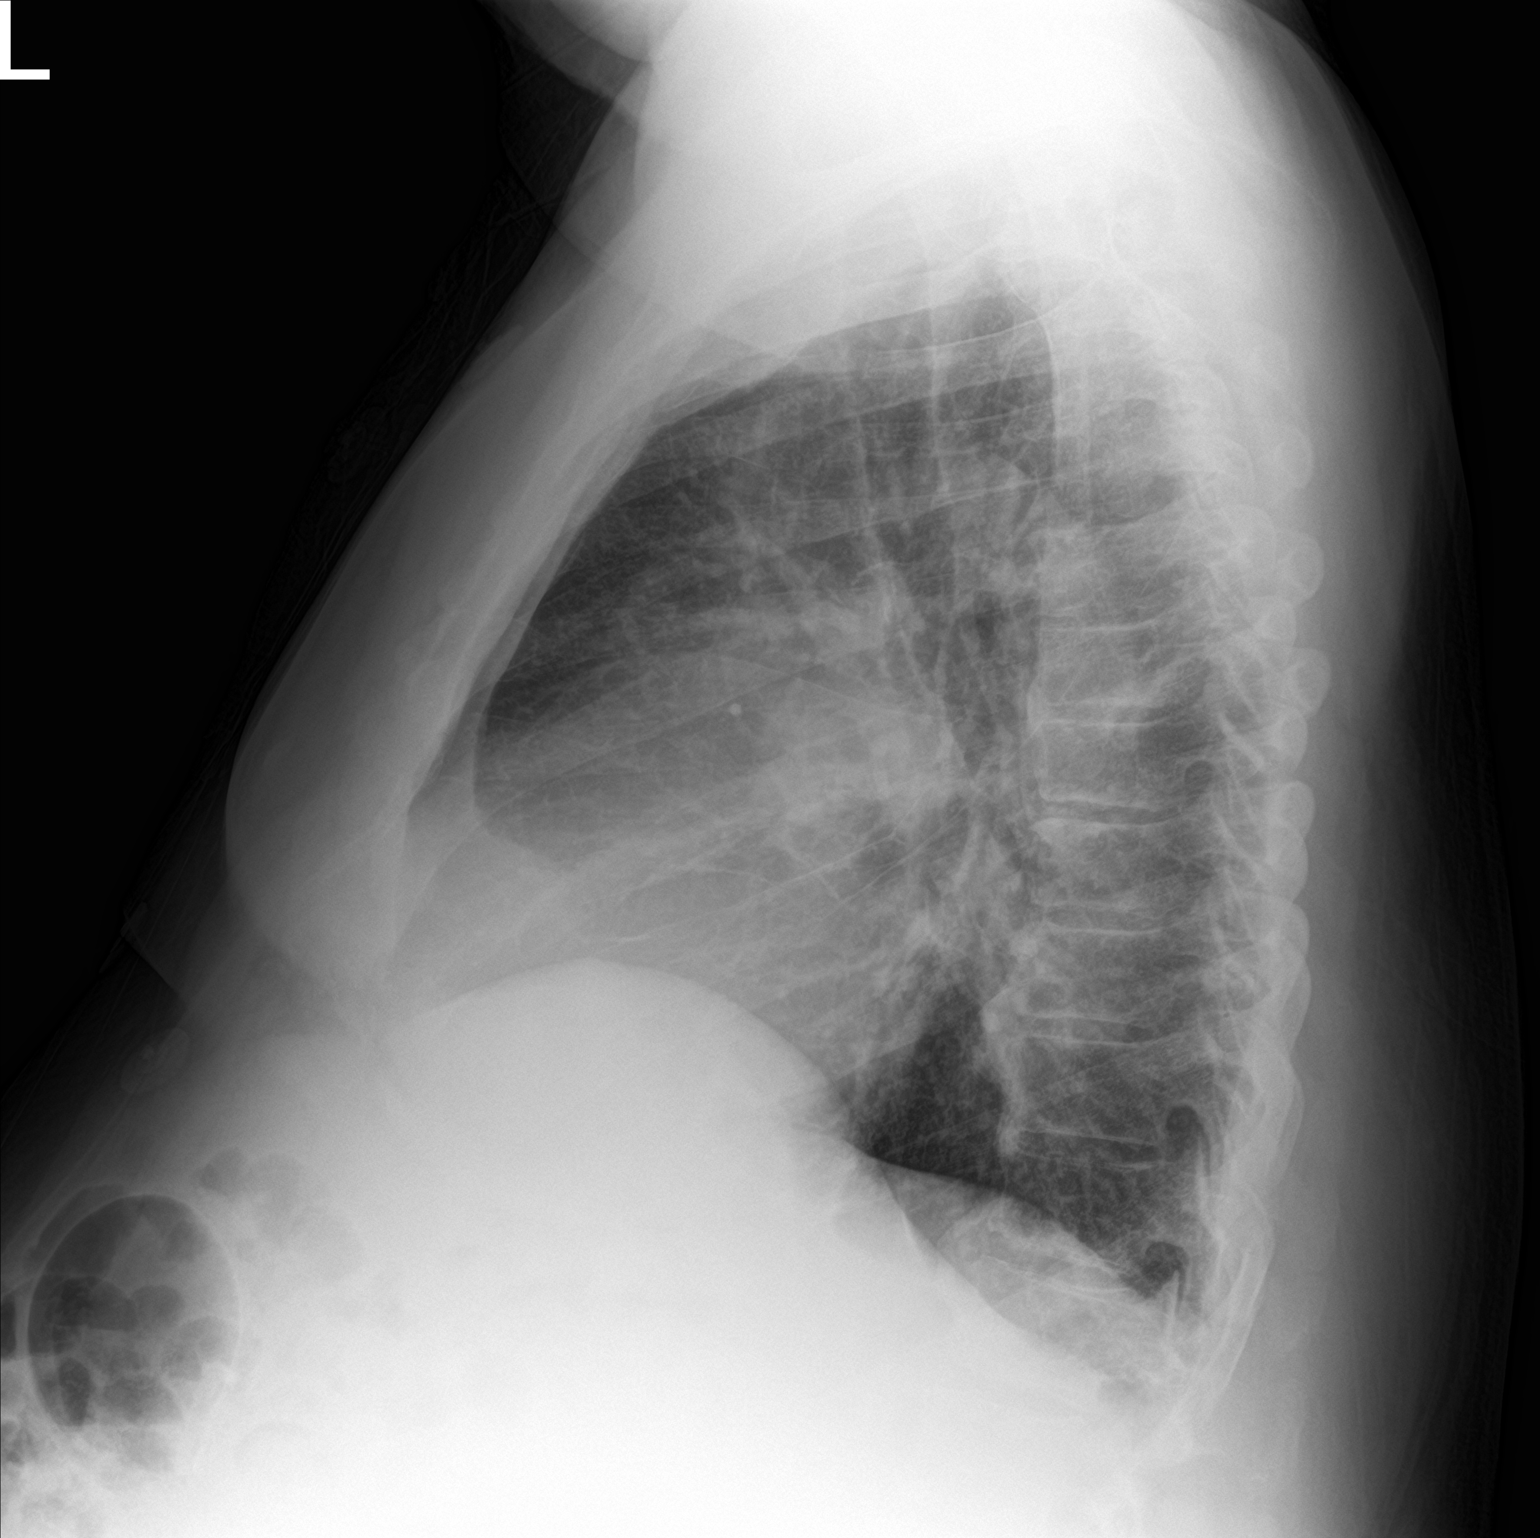

[2 of 2 positions shown; findings below may reference images not displayed]

FINDINGS: Cardiac size is at the upper limits of normal to mildly enlarged.
There is mild tortuosity of the thoracic aorta. There is prominent
central pulmonary vasculature, and an asymmetrically rounded
appearance of the right hilum on the frontal view. Questionable
hilar enlargement on the lateral view (3-3.5 cm diameter). *INSERT*
Asim Kramer mother

Visualized tracheal air column is within normal limits. No
pneumothorax, pleural effusion or confluent pulmonary opacity.
Increased pulmonary vascularity, but no minimal if any pleural fluid
in the fissures.

Negative visible bowel gas pattern. No acute osseous abnormality
identified.
IMPRESSION: 1. No prior study for comparison.
2. Questionable abnormal right hilar enlargement. CT Chest with IV
contrast would evaluate further.
3. Increased pulmonary vascularity, such that mild or developing
interstitial edema is difficult to exclude. No pleural effusion
identified.
4. Mild cardiomegaly.

## 2018-09-06 IMAGING — CT CT CHEST W/O CM
2 of 4 series · 14 of 36 positions shown, 17 images · non-contrast
Comparison: Chest radiographs yesterday.

CLINICAL DATA: Shortness of breath.

EXAM:
CT CHEST WITHOUT CONTRAST
TECHNIQUE: Multidetector CT imaging of the chest was performed following the
standard protocol without IV contrast.

[Series 3: chest wo · axial · 0.93mm/px · z∈[+188,+452]mm · 11 of 156 slices shown, 14 images]
[im 12/156  mediastinal]
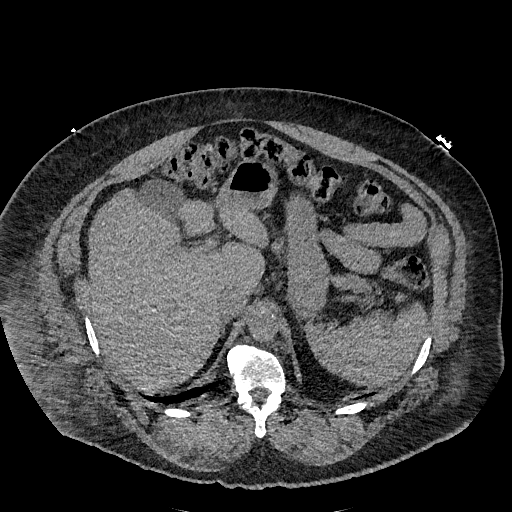
[im 12/156  lung]
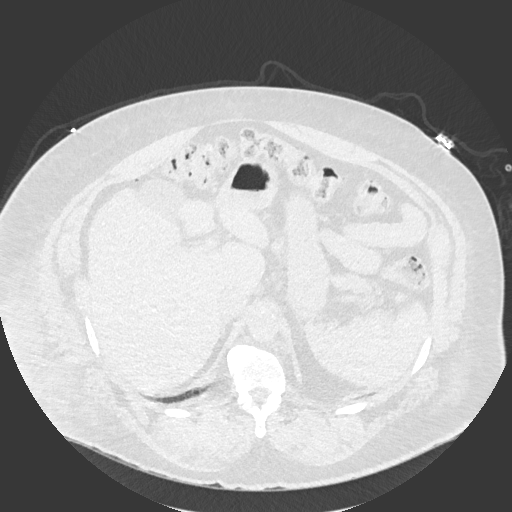
[im 24/156  lung]
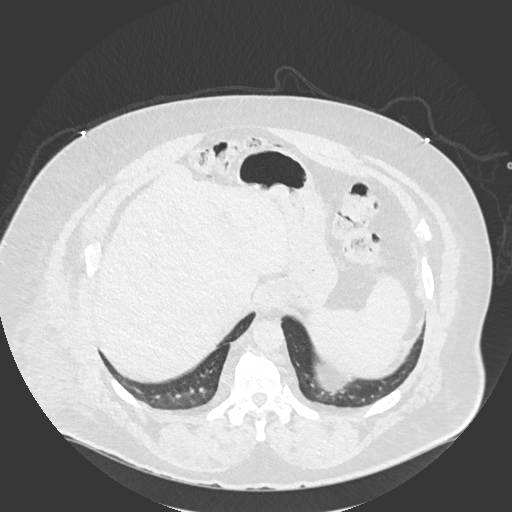
[im 36/156  lung]
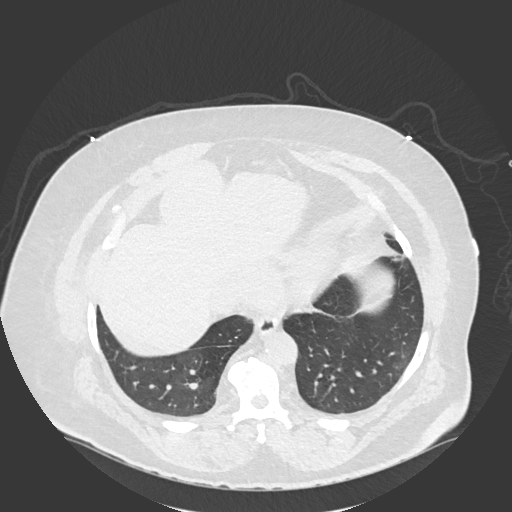
[im 48/156  lung]
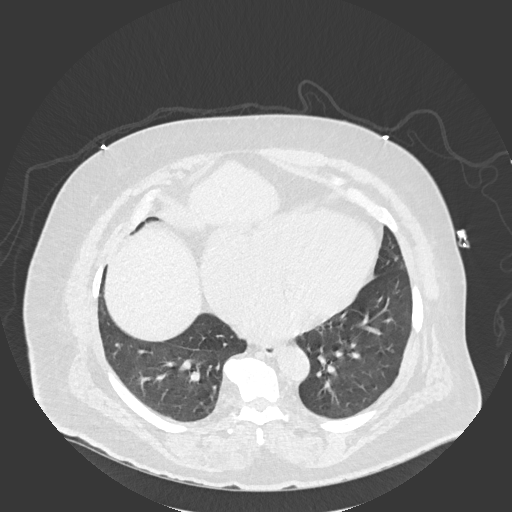
[im 60/156  mediastinal]
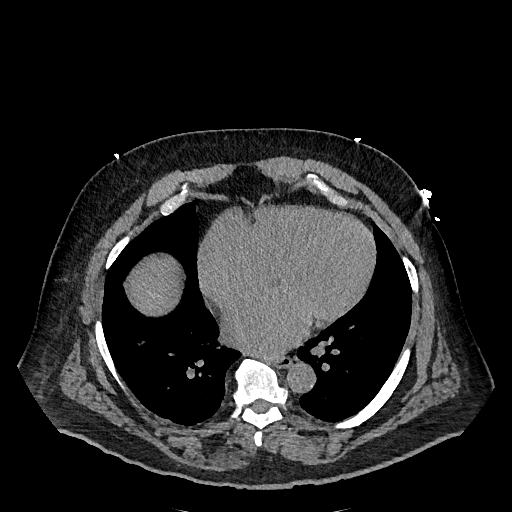
[im 60/156  lung]
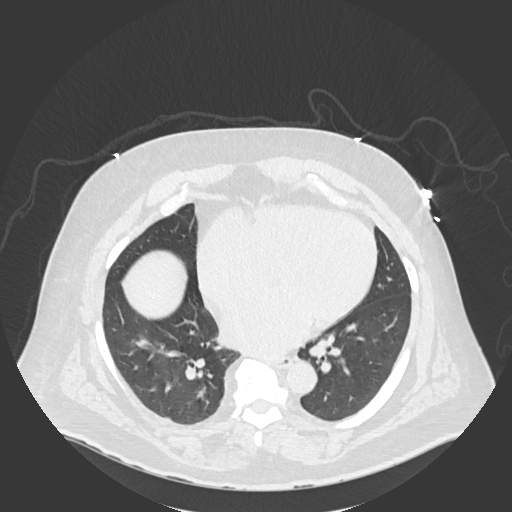
[im 84/156  lung]
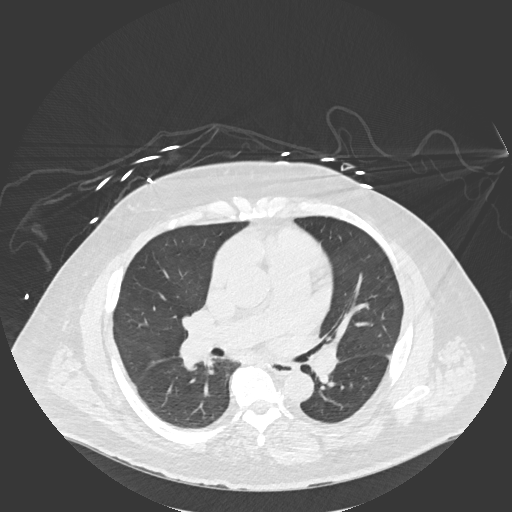
[im 96/156  lung]
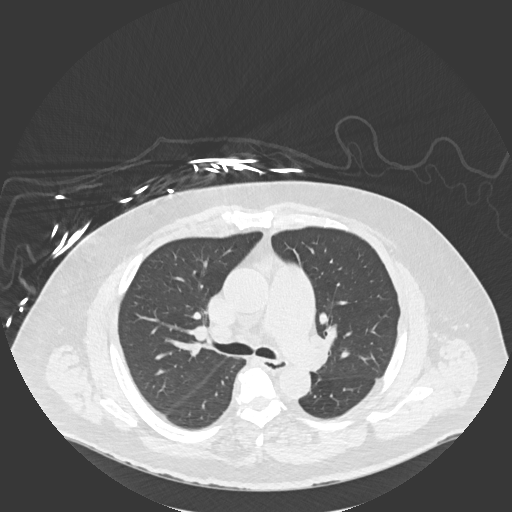
[im 108/156  lung]
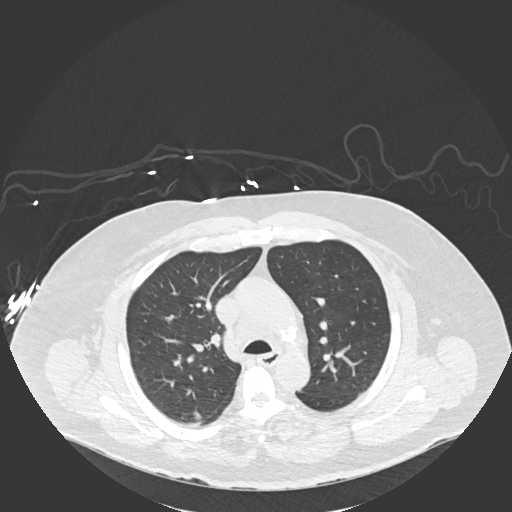
[im 120/156  mediastinal]
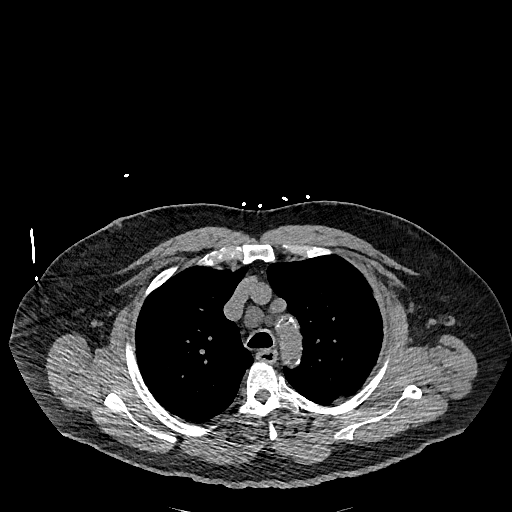
[im 120/156  lung]
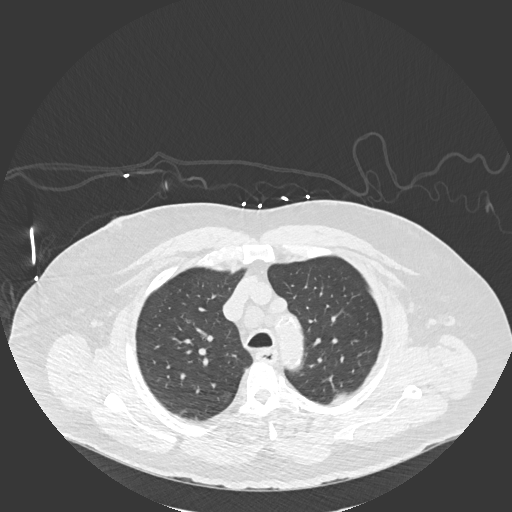
[im 132/156  lung]
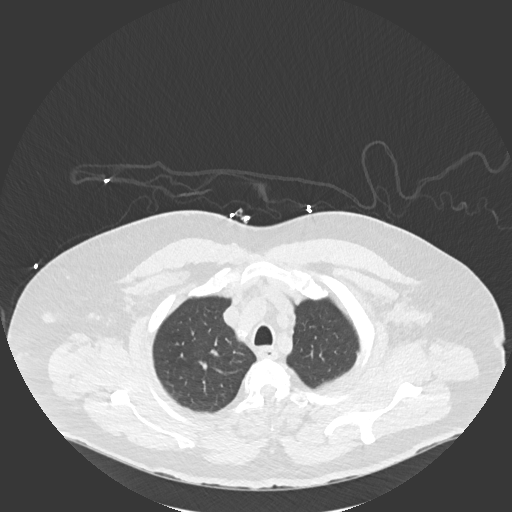
[im 144/156  lung]
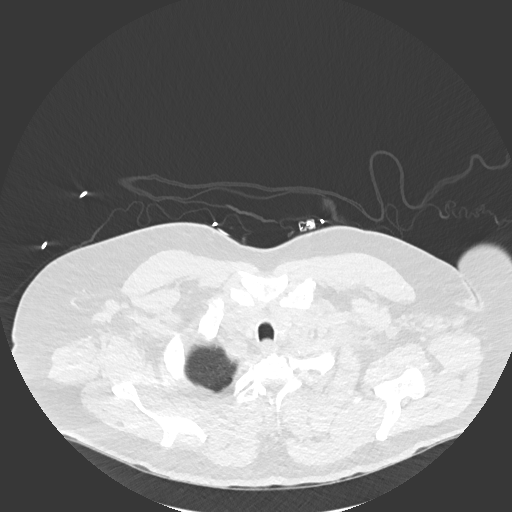

[Series 6: cor · coronal · 0.59mm/px · 3 of 168 slices shown]
[im 34/168  lung]
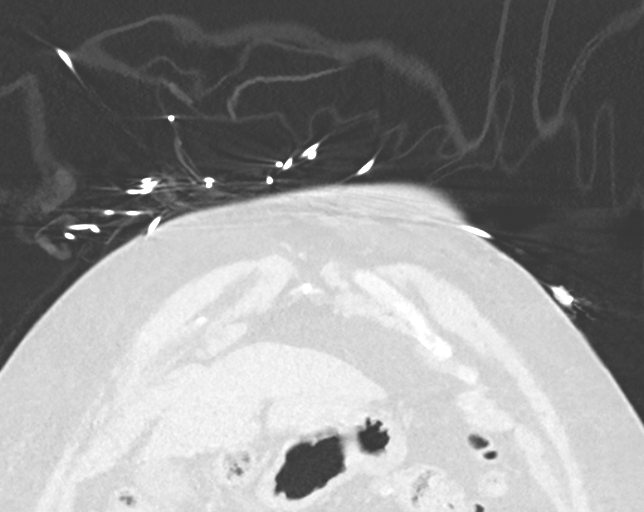
[im 67/168  lung]
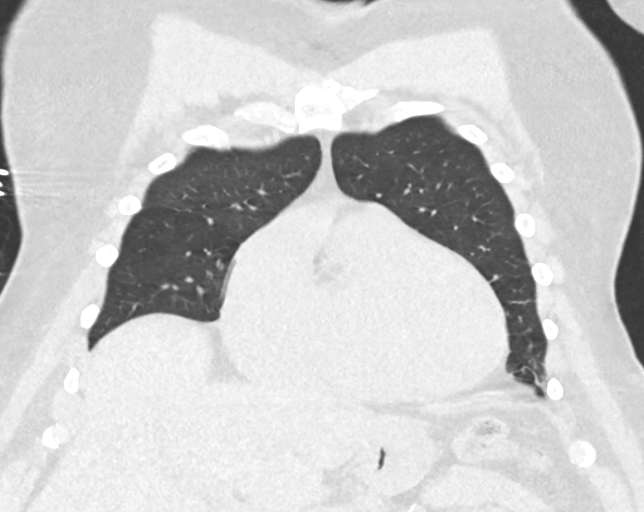
[im 101/168  lung]
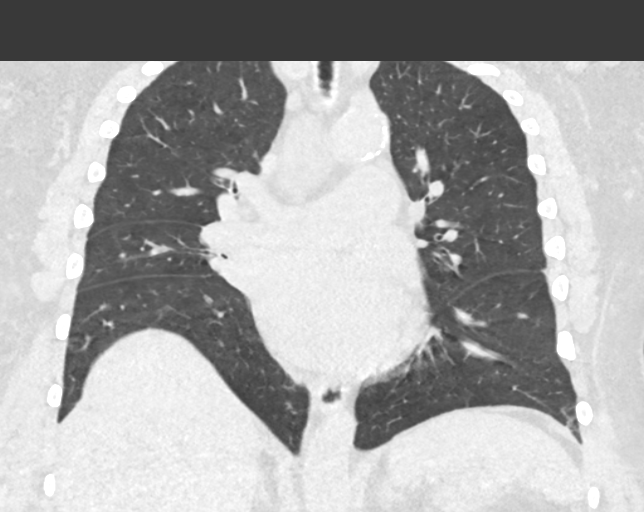

[14 of 36 positions shown; findings below may reference images not displayed]

FINDINGS: Cardiovascular: Mild multi chamber cardiomegaly. There are coronary
artery calcifications. Enlargement of the main pulmonary artery
measuring 4.6 cm. Mild atherosclerosis of the thoracic aorta.

Mediastinum/Nodes: Probable upper paratracheal node measures 16 mm.
This is contiguous with lobular low-density that may reflect a
low-density lymph node versus fluid in the superior pericardial
recess. Additional smaller mediastinal nodes are not enlarged by
size criteria. There is no bulky hilar adenopathy, lack of IV
contrast limits assessment of the hila. The esophagus is
nondistended. Visualized thyroid gland is normal. No axillary
adenopathy.

Lungs/Pleura: Small Bochdalek hernia on the left containing fat.
Tiny subpleural 4 mm nodule in the right lower lobe image 125 series
4. Minimal scattered subpleural and perifissural atelectasis in both
lungs. No consolidation. No evidence pulmonary edema. No pleural
effusion. Trachea and mainstem bronchi are patent.

Upper Abdomen: Incidental colonic diverticulosis. No acute
abnormality.

Musculoskeletal: Bulky anterior osteophytes in the midthoracic
spine. There are no acute or suspicious osseous abnormalities.
IMPRESSION: 1. Multi chamber cardiomegaly. There are coronary artery
calcifications. Mild aortic atherosclerosis.
2. Enlargement of the main pulmonary artery (4.6 cm) consistent with
pulmonary arterial hypertension. Right hilar prominence on prior
radiograph is likely secondary to enlarged pulmonary artery. No
bulky adenopathy, however or hilar evaluation is limited in the
absence of IV contrast.
3. Prominent paratracheal lymph nodes versus fluid in the superior
pericardial recess. Recommend clinical consultation and 3-6 month
follow-up CT.
4. Tiny subpleural 4 mm right lower lobe nodule. This is likely an
intrapulmonary lymph node, however no prior exams available for
comparison. No follow-up needed if patient is low-risk. Non-contrast
chest CT can be considered in 12 months if patient is high-risk.
This recommendation follows the consensus statement: Guidelines for
Management of Incidental Pulmonary Nodules Detected on CT Images:

Aortic Atherosclerosis ([id number]-IZI.I).

## 2019-03-29 ENCOUNTER — Other Ambulatory Visit: Payer: Self-pay | Admitting: Adult Health

## 2022-12-14 DEATH — deceased
